# Patient Record
Sex: Male | Born: 1970 | Race: White | Hispanic: No | Marital: Single | Smoking: Never smoker
Health system: Southern US, Community
[De-identification: ages and names within clinical notes are randomized; demographics above are authoritative.]

## PROBLEM LIST (undated history)

## (undated) DIAGNOSIS — F73 Profound intellectual disabilities: Secondary | ICD-10-CM

## (undated) DIAGNOSIS — M1990 Unspecified osteoarthritis, unspecified site: Secondary | ICD-10-CM

## (undated) HISTORY — DX: Profound intellectual disabilities: F73

## (undated) HISTORY — PX: EYE SURGERY: SHX253

## (undated) HISTORY — PX: CHOLECYSTECTOMY: SHX55

---

## 1998-01-06 ENCOUNTER — Other Ambulatory Visit: Admission: RE | Admit: 1998-01-06 | Discharge: 1998-01-06 | Payer: Self-pay | Admitting: Orthopedic Surgery

## 1998-04-20 ENCOUNTER — Encounter: Payer: Self-pay | Admitting: Gastroenterology

## 1998-04-20 ENCOUNTER — Ambulatory Visit (HOSPITAL_COMMUNITY): Admission: RE | Admit: 1998-04-20 | Discharge: 1998-04-20 | Payer: Self-pay | Admitting: Family Medicine

## 1998-05-08 ENCOUNTER — Encounter (HOSPITAL_BASED_OUTPATIENT_CLINIC_OR_DEPARTMENT_OTHER): Payer: Self-pay | Admitting: General Surgery

## 1998-05-09 ENCOUNTER — Inpatient Hospital Stay (HOSPITAL_COMMUNITY): Admission: RE | Admit: 1998-05-09 | Discharge: 1998-05-10 | Payer: Self-pay | Admitting: General Surgery

## 1998-05-09 ENCOUNTER — Encounter (HOSPITAL_BASED_OUTPATIENT_CLINIC_OR_DEPARTMENT_OTHER): Payer: Self-pay | Admitting: General Surgery

## 1999-01-28 ENCOUNTER — Emergency Department (HOSPITAL_COMMUNITY): Admission: EM | Admit: 1999-01-28 | Discharge: 1999-01-28 | Payer: Self-pay | Admitting: Emergency Medicine

## 1999-03-13 ENCOUNTER — Emergency Department (HOSPITAL_COMMUNITY): Admission: EM | Admit: 1999-03-13 | Discharge: 1999-03-13 | Payer: Self-pay | Admitting: Emergency Medicine

## 2001-07-30 ENCOUNTER — Emergency Department (HOSPITAL_COMMUNITY): Admission: EM | Admit: 2001-07-30 | Discharge: 2001-07-30 | Payer: Self-pay | Admitting: Emergency Medicine

## 2005-01-13 ENCOUNTER — Emergency Department (HOSPITAL_COMMUNITY): Admission: EM | Admit: 2005-01-13 | Discharge: 2005-01-13 | Payer: Self-pay | Admitting: Emergency Medicine

## 2005-07-25 ENCOUNTER — Emergency Department (HOSPITAL_COMMUNITY): Admission: EM | Admit: 2005-07-25 | Discharge: 2005-07-25 | Payer: Self-pay | Admitting: Family Medicine

## 2006-05-10 ENCOUNTER — Encounter: Admission: RE | Admit: 2006-05-10 | Discharge: 2006-05-10 | Payer: Self-pay | Admitting: Family Medicine

## 2011-04-27 ENCOUNTER — Other Ambulatory Visit: Payer: Self-pay

## 2011-04-27 ENCOUNTER — Emergency Department (HOSPITAL_COMMUNITY)
Admission: EM | Admit: 2011-04-27 | Discharge: 2011-04-27 | Disposition: A | Discharge status: Home / Self Care | Payer: Medicare Other | Attending: Emergency Medicine | Admitting: Emergency Medicine

## 2011-04-27 DIAGNOSIS — R000 Tachycardia, unspecified: Secondary | ICD-10-CM | POA: Insufficient documentation

## 2011-04-27 DIAGNOSIS — IMO0002 Reserved for concepts with insufficient information to code with codable children: Secondary | ICD-10-CM | POA: Insufficient documentation

## 2011-04-27 DIAGNOSIS — F79 Unspecified intellectual disabilities: Secondary | ICD-10-CM | POA: Insufficient documentation

## 2011-04-27 DIAGNOSIS — T50901A Poisoning by unspecified drugs, medicaments and biological substances, accidental (unintentional), initial encounter: Secondary | ICD-10-CM | POA: Insufficient documentation

## 2011-04-27 MED ORDER — HALOPERIDOL LACTATE 5 MG/ML IJ SOLN
INTRAMUSCULAR | Status: AC
  Filled 2011-04-27: qty 2

## 2011-04-27 MED ORDER — MIDAZOLAM HCL 2 MG/2ML IJ SOLN
5.0000 mg | Freq: Once | INTRAMUSCULAR | Status: AC
  Administered 2011-04-27: 5 mg via INTRAMUSCULAR

## 2011-04-27 MED ORDER — MIDAZOLAM HCL 2 MG/2ML IJ SOLN
INTRAMUSCULAR | Status: AC
  Administered 2011-04-27: 5 mg via INTRAMUSCULAR
  Filled 2011-04-27: qty 6

## 2011-04-27 NOTE — ED Notes (Signed)
Pt from group home. Was doing am med pass. Pt picked up someone else meds, mistaking them for his. Staff reports pt has been acting dizzy, has not verbalized this. Pt had stroke a few months ago and staff wanted to make sure something else wasn't causing different behavior. Other pts MAR brought by staff. Pt took 5mg  Abilify, 150mg  Thorazine, 2mg  Cogentin, 50mg  Fluvoxamine, 1 cap vit D, 1 tab Pepcid. Pt does not normally take any of these meds. Also received his regular meds today.

## 2011-04-27 NOTE — ED Provider Notes (Signed)
History     CSN: [id number]  Arrival date & time 04/27/11  0932   First MD Initiated Contact with Patient 04/27/11 1002      Chief Complaint  Patient presents with  . Medication Reaction   level V caveat patient patient mentally retarded. History is obtained from Fred Christensen  And Fred Christensen, group, place to accompany patient Patient was actually at that we administered another clients medications 7:30 AM today patient was administered Abilify 5 mg Thorazine 150 mg Cogentin 2 mgfluvioxamine 50 mg vitamin D 1000 units and Pepcid 20 mg. He did not get any of his regular medications scheduled for this morning. Patient has been somewhat more somnolent since taking the other clients medications. (Consider location/radiation/quality/duration/timing/severity/associated sxs/prior treatment) HPI  No past medical history on file. Mental retardation No past surgical history on file.  No family history on file.  History  Substance Use Topics  . Smoking status: Not on file  . Smokeless tobacco: Not on file  . Alcohol Use: Not on file     lives in group home  Review of Systems  Unable to perform ROS: Dementia    Allergies  Review of patient's allergies indicates no known allergies.  Home Medications  No current outpatient prescriptions on file.  BP 160/100  Resp 20  SpO2 97%  Physical Exam  Nursing note and vitals reviewed. HENT:  Head: Normocephalic and atraumatic.  Eyes: Conjunctivae are normal. Pupils are equal, round, and reactive to light.  Neck: Neck supple. No tracheal deviation present. No thyromegaly present.  Cardiovascular: Regular rhythm.   No murmur heard.      Tachycardic  Pulmonary/Chest: Effort normal and breath sounds normal.  Abdominal: Soft. Bowel sounds are normal. He exhibits no distension. There is no tenderness.  Musculoskeletal: Normal range of motion. He exhibits no edema and no tenderness.  Neurological: He is alert. Coordination normal.   Agitated moves all extremities motor strength 5 over 5 overall  Skin: Skin is warm and dry. No rash noted.  Psychiatric: He has a normal mood and affect.    ED Course  Procedures (including critical care time)  Labs Reviewed - No data to display No results found.  Note Fred. Romeo Apple reports that patient is typically agitated when he has a medical encounter he gets premedicated with Halcion typically before a procedure Patient administered Versed 5 mg IM as he was agitated and somewhat combative at 10:35 AM he is resting comfortably  No diagnosis found.   Date: 04/27/2011  Rate: 115  Rhythm: sinus tachycardia  QRS Axis: normal  Intervals: normal  ST/T Wave abnormalities: normal  Conduction Disutrbances:none  Narrative Interpretation:   Old EKG Reviewed: none available Spoke with [facility]. The medications listed but the patient was accidentally administered may cause tachycardia and somnolence. Suggest observation for 4 hours post ingestion no further treatment necessary   1:55 PM alertacting at baseline   MDM  nontoxic ingestion   I plan to resume  Pm medications         Doug Sou, MD 04/27/11 1357

## 2011-04-27 NOTE — ED Notes (Signed)
Unable to obtain Hx, allergies; paperwork to be faxed over from group home. Unable to obtain temp at this time due to pts uncooperativeness.

## 2011-04-27 NOTE — ED Notes (Signed)
Pt will not leave O2 sat monitor on and removed taped sat monitor.

## 2011-04-27 NOTE — ED Notes (Signed)
Versed 5mg  given IM per MD order and Haldol d/c'd

## 2011-04-27 NOTE — ED Notes (Signed)
Pt combative with VS and EKG, attempting to hit, scratch and bite. Group home staff at bed side to assist with pt care.

## 2011-04-27 NOTE — Discharge Instructions (Signed)
Mr. Qin and his usual evening medications tonight. Return if his condition worsens for any reason

## 2011-04-27 NOTE — ED Notes (Signed)
RN reattempted EKG, pt becomes combative and no viable results obtained

## 2011-11-15 DIAGNOSIS — K5909 Other constipation: Secondary | ICD-10-CM | POA: Insufficient documentation

## 2011-11-15 DIAGNOSIS — E559 Vitamin D deficiency, unspecified: Secondary | ICD-10-CM | POA: Insufficient documentation

## 2011-11-15 DIAGNOSIS — F6381 Intermittent explosive disorder: Secondary | ICD-10-CM | POA: Insufficient documentation

## 2011-11-15 DIAGNOSIS — F809 Developmental disorder of speech and language, unspecified: Secondary | ICD-10-CM | POA: Insufficient documentation

## 2011-11-15 DIAGNOSIS — H539 Unspecified visual disturbance: Secondary | ICD-10-CM | POA: Insufficient documentation

## 2011-11-15 DIAGNOSIS — F73 Profound intellectual disabilities: Secondary | ICD-10-CM | POA: Insufficient documentation

## 2012-08-12 ENCOUNTER — Emergency Department (HOSPITAL_COMMUNITY): Payer: Medicare Other

## 2012-08-12 ENCOUNTER — Emergency Department (HOSPITAL_COMMUNITY)
Admission: EM | Admit: 2012-08-12 | Discharge: 2012-08-12 | Disposition: A | Discharge status: Home / Self Care | Payer: Medicare Other | Attending: Emergency Medicine | Admitting: Emergency Medicine

## 2012-08-12 ENCOUNTER — Encounter (HOSPITAL_COMMUNITY): Payer: Self-pay | Admitting: *Deleted

## 2012-08-12 DIAGNOSIS — S1093XA Contusion of unspecified part of neck, initial encounter: Secondary | ICD-10-CM | POA: Insufficient documentation

## 2012-08-12 DIAGNOSIS — S0003XA Contusion of scalp, initial encounter: Secondary | ICD-10-CM | POA: Insufficient documentation

## 2012-08-12 DIAGNOSIS — R296 Repeated falls: Secondary | ICD-10-CM | POA: Insufficient documentation

## 2012-08-12 DIAGNOSIS — H61121 Hematoma of pinna, right ear: Secondary | ICD-10-CM

## 2012-08-12 DIAGNOSIS — Y9389 Activity, other specified: Secondary | ICD-10-CM | POA: Insufficient documentation

## 2012-08-12 DIAGNOSIS — Z79899 Other long term (current) drug therapy: Secondary | ICD-10-CM | POA: Insufficient documentation

## 2012-08-12 DIAGNOSIS — Y929 Unspecified place or not applicable: Secondary | ICD-10-CM | POA: Insufficient documentation

## 2012-08-12 HISTORY — DX: Unspecified osteoarthritis, unspecified site: M19.90

## 2012-08-12 MED ORDER — KETAMINE HCL 50 MG/ML IJ SOLN: 23.8000 mg | Freq: Once | INTRAMUSCULAR | Status: DC

## 2012-08-12 MED ORDER — KETAMINE HCL 10 MG/ML IJ SOLN: 0.5000 mg/kg | Freq: Once | INTRAMUSCULAR | Status: DC

## 2012-08-12 MED ORDER — KETAMINE HCL 50 MG/ML IJ SOLN
23.8000 mg | Freq: Once | INTRAMUSCULAR | Status: AC
  Administered 2012-08-12: 23.8 mg via INTRAMUSCULAR

## 2012-08-12 MED ORDER — MIDAZOLAM HCL 2 MG/2ML IJ SOLN
2.0000 mg | Freq: Once | INTRAMUSCULAR | Status: AC
  Administered 2012-08-12: 2 mg via INTRAVENOUS
  Filled 2012-08-12: qty 2
  Filled 2012-08-12: qty 4

## 2012-08-12 MED ORDER — MIDAZOLAM HCL 2 MG/2ML IJ SOLN
4.0000 mg | Freq: Once | INTRAMUSCULAR | Status: AC
  Administered 2012-08-12: 4 mg via INTRAVENOUS
  Filled 2012-08-12: qty 4

## 2012-08-12 MED ORDER — MIDAZOLAM HCL 2 MG/2ML IJ SOLN
2.0000 mg | Freq: Once | INTRAMUSCULAR | Status: AC
  Administered 2012-08-12: 2 mg via INTRAVENOUS
  Filled 2012-08-12: qty 2

## 2012-08-12 MED ORDER — KETAMINE HCL 50 MG/ML IJ SOLN
30.0000 mg | Freq: Once | INTRAMUSCULAR | Status: AC
  Administered 2012-08-12: 30 mg via INTRAMUSCULAR

## 2012-08-12 MED ORDER — KETAMINE HCL 50 MG/ML IJ SOLN
40.0000 mg | Freq: Once | INTRAMUSCULAR | Status: AC
  Administered 2012-08-12: 40 mg via INTRAMUSCULAR

## 2012-08-12 MED ORDER — KETAMINE HCL 50 MG/ML IJ SOLN
23.8000 mg | Freq: Once | INTRAMUSCULAR | Status: AC
  Administered 2012-08-12: 15:00:00 via INTRAMUSCULAR

## 2012-08-12 MED ORDER — MIDAZOLAM HCL 5 MG/5ML IJ SOLN: 2.0000 mg | Freq: Once | INTRAMUSCULAR | Status: DC

## 2012-08-12 MED ORDER — MIDAZOLAM HCL 2 MG/2ML IJ SOLN
4.0000 mg | Freq: Once | INTRAMUSCULAR | Status: AC
  Administered 2012-08-12: 4 mg via INTRAVENOUS

## 2012-08-12 NOTE — ED Notes (Signed)
PT parents arrived to discuss Plane of care for CT.

## 2012-08-12 NOTE — ED Notes (Signed)
Meds given for CT given.

## 2012-08-12 NOTE — ED Notes (Signed)
PT arrived to room E41 alert and agitated . This is his base line. Pt lives in a group home . The report is Pt fell and hit his RT ear and bruising is seen on external ear surface. Pt speech is with in normal limits . Pt agitated and unable to tol B/P.

## 2012-08-12 NOTE — ED Provider Notes (Signed)
History    CSN: [id number] Arrival date & time 08/12/12  1100  First MD Initiated Contact with Patient 08/12/12 1122     No chief complaint on file.  (Consider location/radiation/quality/duration/timing/severity/associated sxs/prior Treatment) HPI Comments: Fred Christensen is a 42yo male with intellectual disability who resides at [facility].  Patient is unable to provide history secondary to mentation.  [name] and [name], staff of [facility] brought Fred Christensen to the ED and are providing the history.  They report that Fred Christensen fell on his right side yesterday morning while getting up for his shower.  The staff say they saw no evidence of bruising or swelling yesterday but today noticed his right ear was swollen and and bruised.  They say he did not appear to hit the back of his head or face.  He generally communicates pain via non-verbal signing or grabbing staff's arm.  They say it is hard to tell if he is currently having ear pain.   They deny any change in his behavior from his baseline agitation.  He has had no rhinorrhea, bleeding from the ear or mouth, N/V.   No past medical history on file. No past surgical history on file. No family history on file. History  Substance Use Topics  . Smoking status: Never Smoker   . Smokeless tobacco: Not on file  . Alcohol Use: No    Review of Systems  Unable to perform ROS: Other    Allergies  Review of patient's allergies indicates no known allergies.  Home Medications   Current Outpatient Rx  Name  Route  Sig  Dispense  Refill  . acetaminophen (TYLENOL) 325 MG tablet   Oral   Take 650 mg by mouth every morning.         Marland Kitchen acetaminophen (TYLENOL) 650 MG CR tablet   Oral   Take 650 mg by mouth at bedtime.         . carbamazepine (TEGRETOL) 200 MG tablet   Oral   Take 200 mg by mouth 2 (two) times daily.         . cholecalciferol (VITAMIN D) 1000 UNITS tablet   Oral   Take 1,000 Units by mouth daily.        . fluticasone (FLONASE) 50 MCG/ACT nasal spray   Nasal   Place 2 sprays into the nose daily.         Marland Kitchen gabapentin (NEURONTIN) 300 MG capsule   Oral   Take 300 mg by mouth 2 (two) times daily.         Marland Kitchen lactulose (CHRONULAC) 10 GM/15ML solution   Oral   Take 30 g by mouth at bedtime.         . naltrexone (DEPADE) 50 MG tablet   Oral   Take 25 mg by mouth 3 (three) times daily.         Marland Kitchen omeprazole (PRILOSEC) 20 MG capsule   Oral   Take 20 mg by mouth daily.         . psyllium (HYDROCIL/METAMUCIL) 95 % PACK   Oral   Take 1 packet by mouth every morning.          BP 134/88  Pulse 108  Temp(Src) 99.4 F (37.4 C) (Oral)  Resp 20 Physical Exam  HENT:  Left Ear: External ear normal.  Mouth/Throat: Oropharynx is clear and moist.  Right ear antihelix with ecchymosis, hematoma.  Ecchymosis of mastoid behind R ear.  Eyes:  Patient  squeezing eyes shut during eye exam.  Neck: Neck supple.  Cardiovascular: Normal rate and regular rhythm.   Pulmonary/Chest: Effort normal and breath sounds normal.  Musculoskeletal: Normal range of motion.    ED Course  Procedural sedation Date/Time: 08/12/2012 7:25 PM Performed by: Ronna Polio, ANN Authorized by: Ronna Polio, ANN Consent: Verbal consent obtained. written consent obtained. Risks and benefits: risks, benefits and alternatives were discussed Consent given by: guardian and parent Required items: required blood products, implants, devices, and special equipment available Patient identity confirmed: arm band Time out: Immediately prior to procedure a "time out" was called to verify the correct patient, procedure, equipment, support staff and site/side marked as required. Local anesthesia used: no Patient sedated: yes Sedation type: moderate (conscious) sedation Sedatives: ketamine and midazolam Vitals: Vital signs were monitored during sedation. Patient tolerance: Patient tolerated the procedure well  with no immediate complications. Comments: This patient required procedural sedation in order to obtain head CT. Multiple doses of ketamine IV were given as well as multiple doses of Versed IV. Her main with the patient during the entire procedure. He had no hemodynamic or respiratory instability per my exam. She tolerated the CT scan well.   (including critical care time) Labs Reviewed - No data to display No results found. No diagnosis found.  MDM  Concern for basilar skull fracture given bruising of mastoid.  Will need CT to assess.  Pt unable to undergo procedures without substantial sedation due to baseline agitation.  We spoke with patient's guardians (mother and step-father) regarding the need for CT and the plan to medicate pt prior to CT.  Risk and benefits of sedation were discussed and parents consented.  Spoke with Dr. Pollyann Kennedy of [facility] regarding right auricular hematoma.  He advised that drainage was not emergent and that the patient can be seen in office tomorrow for drainage.  [name], DO 08/12/12 1311    Attending Note: Fred Christensen is a 42 year old gentleman with a history of intellectual disability who currently resides in assisted living type situation. His caretakers noted that he had a fall yesterday and brought him into the emergency department today due to concern over a bruise to his right ear. Exam and history is extremely limited due to the patient's mental status. He does have ecchymosis to his mastoid which is concerning for possible skull fracture; therefore, CT scan is necessary to evaluate further. He also has a hematoma to his ear which is small in size. We did discuss the potential treatment of this with on-call ENT, who states the patient can followup as an outpatient. Due to the patient's mental status and significant agitation, procedural sedation was required in order to obtain CT scan. Multiple doses of IV ketamine and Versed were given in order  to obtain sedation , and patient maintained his airway and tolerated procedure well. CT scan was negative for any intracranial or bony injuries. The patient was discharged home with followup instructions.  Ashby Dawes, MD 08/12/12 [phone]

## 2012-11-29 ENCOUNTER — Other Ambulatory Visit: Payer: Self-pay

## 2014-01-06 ENCOUNTER — Ambulatory Visit (INDEPENDENT_AMBULATORY_CARE_PROVIDER_SITE_OTHER): Payer: Medicare Other | Admitting: Podiatry

## 2014-01-06 ENCOUNTER — Encounter: Payer: Self-pay | Admitting: Podiatry

## 2014-01-06 VITALS — Ht 68.0 in | Wt 90.0 lb

## 2014-01-06 DIAGNOSIS — B351 Tinea unguium: Secondary | ICD-10-CM

## 2014-01-06 DIAGNOSIS — M79676 Pain in unspecified toe(s): Secondary | ICD-10-CM

## 2014-01-06 NOTE — Progress Notes (Signed)
   Subjective:    Patient ID: Fred CombsCharles W Tartt, male    DOB: 11/30/1970, 43 y.o.   MRN: [id number]  HPI Comments: N debridement L 1-5 right toenails D and O long-term C elongated, painful toenails A too thick for caregiver to trim T hx of debridement of left 1-5 toenails by caregiver  Pt refused to have vital signs taken.     Review of Systems  All other systems reviewed and are negative.      Objective:   Physical Exam This patient presents with 2 caregivers from assisted-living He is nonverbal and noncooperative  Vascular: DP and PT pulses 2/4 bilaterally  Neurological: Ankle flex equal and reactive bilaterally  Dermatological: The toenails are extremely elongated, hypertrophic, discolored 6-10  Musculoskeletal: Hammertoe deformity second bilaterally       Assessment & Plan:   Assessment: Mentally impaired nonverbal patient Symptomatic onychomycoses 6-10  Plan: Debrided toenails 10 without a bleeding  Reappoint as needed

## 2014-01-07 ENCOUNTER — Encounter: Payer: Self-pay | Admitting: Podiatry

## 2017-03-31 ENCOUNTER — Emergency Department (HOSPITAL_BASED_OUTPATIENT_CLINIC_OR_DEPARTMENT_OTHER)
Admission: EM | Admit: 2017-03-31 | Discharge: 2017-03-31 | Disposition: A | Discharge status: Home / Self Care | Payer: Medicare Other | Attending: Emergency Medicine | Admitting: Emergency Medicine

## 2017-03-31 ENCOUNTER — Other Ambulatory Visit: Payer: Self-pay

## 2017-03-31 ENCOUNTER — Encounter (HOSPITAL_BASED_OUTPATIENT_CLINIC_OR_DEPARTMENT_OTHER): Payer: Self-pay | Admitting: *Deleted

## 2017-03-31 DIAGNOSIS — S01511A Laceration without foreign body of lip, initial encounter: Secondary | ICD-10-CM | POA: Insufficient documentation

## 2017-03-31 DIAGNOSIS — Z23 Encounter for immunization: Secondary | ICD-10-CM | POA: Diagnosis not present

## 2017-03-31 DIAGNOSIS — Z79899 Other long term (current) drug therapy: Secondary | ICD-10-CM | POA: Diagnosis not present

## 2017-03-31 DIAGNOSIS — W0110XA Fall on same level from slipping, tripping and stumbling with subsequent striking against unspecified object, initial encounter: Secondary | ICD-10-CM | POA: Diagnosis not present

## 2017-03-31 DIAGNOSIS — Y929 Unspecified place or not applicable: Secondary | ICD-10-CM | POA: Diagnosis not present

## 2017-03-31 DIAGNOSIS — Y999 Unspecified external cause status: Secondary | ICD-10-CM | POA: Insufficient documentation

## 2017-03-31 DIAGNOSIS — F73 Profound intellectual disabilities: Secondary | ICD-10-CM | POA: Insufficient documentation

## 2017-03-31 DIAGNOSIS — Y939 Activity, unspecified: Secondary | ICD-10-CM | POA: Insufficient documentation

## 2017-03-31 MED ORDER — ACETAMINOPHEN 160 MG/5ML PO SOLN
650.0000 mg | Freq: Once | ORAL | Status: AC
  Administered 2017-03-31: 650 mg via ORAL
  Filled 2017-03-31: qty 20.3

## 2017-03-31 MED ORDER — ONDANSETRON HCL 4 MG/2ML IJ SOLN
4.0000 mg | Freq: Once | INTRAMUSCULAR | Status: AC
  Administered 2017-03-31: 4 mg via INTRAVENOUS
  Filled 2017-03-31: qty 2

## 2017-03-31 MED ORDER — LIDOCAINE HCL (PF) 1 % IJ SOLN
5.0000 mL | Freq: Once | INTRAMUSCULAR | Status: AC
  Administered 2017-03-31: 5 mL
  Filled 2017-03-31: qty 5

## 2017-03-31 MED ORDER — TETANUS-DIPHTH-ACELL PERTUSSIS 5-2.5-18.5 LF-MCG/0.5 IM SUSP
0.5000 mL | Freq: Once | INTRAMUSCULAR | Status: AC
  Administered 2017-03-31: 0.5 mL via INTRAMUSCULAR
  Filled 2017-03-31: qty 0.5

## 2017-03-31 MED ORDER — KETAMINE HCL 10 MG/ML IJ SOLN
INTRAMUSCULAR | Status: AC
  Filled 2017-03-31: qty 1

## 2017-03-31 MED ORDER — ETOMIDATE 2 MG/ML IV SOLN
INTRAVENOUS | Status: AC | PRN
  Administered 2017-03-31: 6 mg via INTRAVENOUS

## 2017-03-31 MED ORDER — KETAMINE HCL 10 MG/ML IJ SOLN
INTRAMUSCULAR | Status: AC | PRN
  Administered 2017-03-31: 38 mg via INTRAVENOUS
  Administered 2017-03-31 (×2): 6 mg via INTRAVENOUS
  Administered 2017-03-31 (×2): 20 mg via INTRAVENOUS

## 2017-03-31 MED ORDER — ETOMIDATE 2 MG/ML IV SOLN
20.0000 mg | Freq: Once | INTRAVENOUS | Status: AC
  Administered 2017-03-31: 20 mg via INTRAVENOUS
  Filled 2017-03-31: qty 10

## 2017-03-31 NOTE — ED Notes (Signed)
EDP and this RN in room to review procedure with pt parents. Consent signed.

## 2017-03-31 NOTE — Sedation Documentation (Signed)
Pt very restless, moving back and forth in bed. Pt shaking head and arms. Unable to obtain vitals.

## 2017-03-31 NOTE — ED Provider Notes (Signed)
Medical screening examination/treatment/procedure(s) were conducted as a shared visit with non-physician practitioner(s) and myself.  I personally evaluated the patient during the encounter. Briefly, the patient is a 47 y.o. male chemical fall resulting in upper lip laceration approximately 1 cm involving the vermilion border.  Tetanus updated in the emergency department.  Wound was irrigated and closed with 1 suture under conscious sedation.    EKG Interpretation None        .Sedation Date/Time: 03/31/2017 6:12 PM Performed by: Nira Conn, MD Authorized by: Nira Conn, MD   Consent:    Consent obtained:  Verbal and written   Consent given by:  Parent   Risks discussed:  Allergic reaction, dysrhythmia, inadequate sedation, nausea, prolonged hypoxia resulting in organ damage, prolonged sedation necessitating reversal, respiratory compromise necessitating ventilatory assistance and intubation and vomiting   Alternatives discussed:  Analgesia without sedation, anxiolysis and regional anesthesia Universal protocol:    Procedure explained and questions answered to patient or proxy's satisfaction: yes     Relevant documents present and verified: yes     Test results available and properly labeled: yes     Imaging studies available: yes     Required blood products, implants, devices, and special equipment available: yes     Site/side marked: yes     Immediately prior to procedure a time out was called: yes     Patient identity confirmation method:  Verbally with patient Indications:    Procedure necessitating sedation performed by:  Physician performing sedation   Intended level of sedation:  Deep Pre-sedation assessment:    Time since last food or drink:  1200p   ASA classification: class 1 - normal, healthy patient     Neck mobility: normal     Mouth opening:  3 or more finger widths   Thyromental distance:  4 finger widths   Mallampati score:  I - soft palate,  uvula, fauces, pillars visible   Pre-sedation assessments completed and reviewed: airway patency, cardiovascular function, hydration status, mental status, nausea/vomiting, pain level, respiratory function and temperature     Pre-sedation assessment completed:  03/31/2017 6:10 PM Immediate pre-procedure details:    Reassessment: Patient reassessed immediately prior to procedure     Reviewed: vital signs, relevant labs/tests and NPO status     Verified: bag valve mask available, emergency equipment available, intubation equipment available, IV patency confirmed, oxygen available and suction available   Procedure details (see MAR for exact dosages):    Preoxygenation:  Nasal cannula   Sedation:  Ketamine and etomidate   Intra-procedure monitoring:  Blood pressure monitoring, cardiac monitor, continuous pulse oximetry, frequent LOC assessments, frequent vital sign checks and continuous capnometry   Intra-procedure events: none     Total Provider sedation time (minutes):  20 Post-procedure details:    Post-sedation assessment completed:  03/31/2017 6:55 PM   Attendance: Constant attendance by certified staff until patient recovered     Recovery: Patient returned to pre-procedure baseline     Post-sedation assessments completed and reviewed: airway patency, cardiovascular function, hydration status, mental status, nausea/vomiting, pain level, respiratory function and temperature     Patient is stable for discharge or admission: yes     Patient tolerance:  Tolerated well, no immediate complications  .Marland KitchenLaceration Repair Date/Time: 03/31/2017 6:55 PM Performed by: Nira Conn, MD Authorized by: Nira Conn, MD   Consent:    Consent obtained:  Written   Consent given by:  Parent   Risks discussed:  Poor cosmetic result  Alternatives discussed:  Delayed treatment Laceration details:    Location:  Lip   Lip location:  Upper exterior lip   Length (cm):  1 Repair type:     Repair type:  Simple Treatment:    Area cleansed with:  Betadine   Amount of cleaning:  Standard   Irrigation solution:  Sterile saline   Irrigation method:  Syringe Skin repair:    Repair method:  Sutures   Suture size:  5-0   Wound skin closure material used: Vicryl rapide.   Suture technique:  Simple interrupted   Number of sutures:  1 Approximation:    Approximation:  Close   Vermilion border: well-aligned   Post-procedure details:    Dressing:  Antibiotic ointment      Faatimah Spielberg, Amadeo GarnetPedro Eduardo, MD 03/31/17 1856

## 2017-03-31 NOTE — ED Notes (Signed)
ED Provider at bedside. 

## 2017-03-31 NOTE — Sedation Documentation (Signed)
Pt interacting with family as he was prior to procedure. Pt appears to be in NAD. Pt requesting something to eat.

## 2017-03-31 NOTE — Sedation Documentation (Addendum)
Pt restless. MD verbal order for 6mg  of atomidate

## 2017-03-31 NOTE — ED Notes (Signed)
Pt parents and caregiver at bedside

## 2017-03-31 NOTE — Discharge Instructions (Addendum)
As discussed, follow up with his Primary care provider in 3 days for wound check.  Monitor for any signs of infection and return sooner if swelling, pain, purulence, redness, fever, chills or any other new concerning symptoms in the meantime.  Tylenol or ibuprofen for pain as needed.

## 2017-03-31 NOTE — ED Notes (Signed)
Pt given gingerale and crackers 

## 2017-03-31 NOTE — ED Provider Notes (Signed)
[facility] EMERGENCY DEPARTMENT Provider Note   CSN: [id number] Arrival date & time: 03/31/17  1304     History   Chief Complaint Chief Complaint  Patient presents with  . Fall  . Facial Injury    HPI Fred Christensen is a 47 y.o. male with past history of arthritis and profound mental delay presenting with facial trauma after a trip and fall in which he lacerated his upper lip.  Per caregiver patient is at baseline, no loss of consciousness.  Denies any other injuries or trauma.  He currently lives in a group home and his mother and father are legal guardian.  Patient verbal responses are non-comprehensible and he is pointing at the otoscope to have his ears checked and continuously pointing at his upper lip.   HPI  Past Medical History:  Diagnosis Date  . Arthritis   . Profound mental retardation     There are no active problems to display for this patient.   History reviewed. No pertinent surgical history.     Home Medications    Prior to Admission medications   Medication Sig Start Date End Date Taking? Authorizing Provider  acetaminophen (TYLENOL) 325 MG tablet Take 650 mg by mouth every morning.    [provider]  acetaminophen (TYLENOL) 650 MG CR tablet Take 650 mg by mouth every evening.     [provider]  carbamazepine (TEGRETOL) 200 MG tablet Take 200 mg by mouth 2 (two) times daily.    [provider]  cholecalciferol (VITAMIN D) 1000 UNITS tablet Take 1,000 Units by mouth daily.    [provider]  fluticasone (FLONASE) 50 MCG/ACT nasal spray Place 2 sprays into the nose daily.    [provider]  gabapentin (NEURONTIN) 300 MG capsule Take 300 mg by mouth 2 (two) times daily.    [provider]  lactulose (CHRONULAC) 10 GM/15ML solution Take 30 g by mouth at bedtime.    [provider]  naltrexone (DEPADE) 50 MG tablet Take 25 mg by mouth 3 (three) times daily.    [provider]  omeprazole (PRILOSEC) 20 MG capsule Take 20 mg by mouth daily.    [provider]  psyllium (HYDROCIL/METAMUCIL) 95 % PACK Take 1 packet by mouth every morning.    [provider]    Family History No family history on file.  Social History Social History   Tobacco Use  . Smoking status: Never Smoker  . Smokeless tobacco: Never Used  Substance Use Topics  . Alcohol use: No  . Drug use: No     Allergies   Haldol [haloperidol]   Review of Systems Review of Systems  Unable to perform ROS: Patient nonverbal  Skin: Positive for wound.     Physical Exam Updated Vital Signs BP (!) 125/94 (BP Location: Right Arm)   Pulse 95   Temp 98.2 F (36.8 C) (Oral)   Resp 19   SpO2 99%   Physical Exam  Constitutional: He appears well-developed and well-nourished. No distress.  Well-appearing, nontoxic sitting in the bed no acute distress agitated making verbal sounds and pointing at his ears and otoscope as well as the upper lip.   HENT:  Head: Normocephalic and atraumatic.  Right Ear: External ear normal.  Left Ear: External ear normal.  Mouth/Throat: Oropharynx is clear and moist.  Normal tympanic membranes bilaterally. Teeth are sharp and damaged at baseline. No new injury to the teeth  Eyes: Conjunctivae are normal.  Neck: Normal range of motion. Neck supple.  Cardiovascular: Normal rate, regular rhythm and normal heart sounds.  No murmur heard. Pulmonary/Chest: Effort normal and breath sounds normal. No stridor. No respiratory distress. He has no wheezes. He has no rales.  Abdominal: He exhibits no distension.  Musculoskeletal: Normal range of motion. He exhibits no edema, tenderness or deformity.  Patient is ambulatory  Neurological: He is alert.  Normal stance and gait  Skin: Skin is warm and dry. No rash noted. He is not diaphoretic. No erythema. No pallor.  Approximately 1 cm vertical midline laceration crossing the vermilion border  of the upper lip.  Psychiatric: He has a normal mood and affect.  Nursing note and vitals reviewed.    ED Treatments / Results  Labs (all labs ordered are listed, but only abnormal results are displayed) Labs Reviewed - No data to display  EKG  EKG Interpretation None       Radiology No results found.  Procedures .Sedation Date/Time: 03/31/2017 5:38 PM Performed by: Georgiana Shore, PA-C Authorized by: Georgiana Shore, PA-C   Consent:    Consent obtained:  Verbal and written   Consent given by:  Guardian Indications:    Procedure performed:  Laceration repair Immediate pre-procedure details:    Reviewed: vital signs     Verified: bag valve mask available, emergency equipment available, intubation equipment available, IV patency confirmed, oxygen available and suction available     (See Dr. Harland Dingwall chart for full procedure note)   LACERATION REPAIR Performed by: Georgiana Shore Dr. Eudelia Bunch Authorized by: Georgiana Shore Consent: Verbal consent obtained. Risks and benefits: risks, benefits and alternatives were discussed Consent given by: patient Patient identity confirmed: provided demographic data Prepped and Draped in normal sterile fashion Wound explored  Laceration Location: upper lip crossing the vermillion border  Laceration Length: 1cm  No Foreign Bodies seen or palpated  Anesthesia: local infiltration  Local anesthetic: n/a  Anesthetic total: n/a  Irrigation method: syringe Amount of cleaning: standard  Skin closure: 5.0 vicryl  Number of sutures: 1  Technique: simple interrupted  Patient tolerance: Patient sedated for procedure   Medications Ordered in ED Medications  lidocaine (PF) (XYLOCAINE) 1 % injection 5 mL (5 mLs Infiltration Given 03/31/17 1749)  Tdap (BOOSTRIX) injection 0.5 mL (0.5 mLs Intramuscular Given 03/31/17 1750)  acetaminophen (TYLENOL) solution 650 mg (650 mg Oral Given 03/31/17 1606)  etomidate  (AMIDATE) injection 20 mg (20 mg Intravenous Given 03/31/17 1812)  ketamine (KETALAR) injection (20 mg Intravenous Given 03/31/17 1831)  etomidate (AMIDATE) injection (6 mg Intravenous Given 03/31/17 1820)  ondansetron (ZOFRAN) injection 4 mg (4 mg Intravenous Given 03/31/17 1949)     Initial Impression / Assessment and Plan / ED Course  I have reviewed the triage vital signs and the nursing notes.  Pertinent labs & imaging results that were available during my care of the patient were reviewed by me and considered in my medical decision making (see chart for details).     Presents with laceration of upper lip after trip and fall, no loc, no other injury or trauma. Patient with severe mental delay requiring sedation for repair. Parents and legal guardians at bedside provided consent for procedural sedation.  Lac repair under sedation with Dr. Eudelia Bunch.  irrigation performed. Wound explored and base of wound visualized in a bloodless field without evidence of foreign body.  Laceration occurred < 8 hours prior to repair which was well tolerated. Tdap updated.  Pt has no comorbidities  to effect normal wound healing. Pt discharged  without antibiotics.    Discussed suture home care with patient and answered questions. Pt to follow-up for wound check in 3-5 days with PCP; they are to return to the ED sooner for signs of infection. Pt is hemodynamically stable with no complaints prior to dc.   Just prior to discharge, patient were concerned that he was pointing at his abdomen and was very active for a while but became sleepy and less energetic. They asked if this may be due to the sedating medications given and are worried about him vomiting during travel for a 40 minute drive.  His abdomen was soft and non-tender to palpation and he was back to being very active and mom stated that he was now his normal self again. Patient had crackers and Ginger ale and tolerating PO well. No vomiting. He was given zofran  in the ED.  Discharge home with close PCP follow up. Discussed strict return precautions and advised to return to the emergency department if experiencing any new or worsening symptoms. Instructions were understood and parents agreed with discharge plan. Final Clinical Impressions(s) / ED Diagnoses   Final diagnoses:  Lip laceration, initial encounter    ED Discharge Orders    None       Georgiana ShoreMitchell, Jessica B, [place]-C 03/31/17 1955

## 2017-03-31 NOTE — Sedation Documentation (Signed)
Pt very restless. Moving back and forth in bed.

## 2017-03-31 NOTE — Sedation Documentation (Signed)
etCO2 unable to be continuously monitored due to place of wound and EDP needing to apply sutures.

## 2017-03-31 NOTE — ED Triage Notes (Signed)
He tripped and fell. Upper lip laceration.

## 2018-01-02 ENCOUNTER — Emergency Department (HOSPITAL_BASED_OUTPATIENT_CLINIC_OR_DEPARTMENT_OTHER): Payer: Medicare Other

## 2018-01-02 ENCOUNTER — Encounter (HOSPITAL_BASED_OUTPATIENT_CLINIC_OR_DEPARTMENT_OTHER): Payer: Self-pay | Admitting: Emergency Medicine

## 2018-01-02 ENCOUNTER — Other Ambulatory Visit: Payer: Self-pay

## 2018-01-02 ENCOUNTER — Emergency Department (HOSPITAL_BASED_OUTPATIENT_CLINIC_OR_DEPARTMENT_OTHER)
Admission: EM | Admit: 2018-01-02 | Discharge: 2018-01-02 | Disposition: A | Discharge status: Home / Self Care | Payer: Medicare Other | Attending: Emergency Medicine | Admitting: Emergency Medicine

## 2018-01-02 DIAGNOSIS — M25561 Pain in right knee: Secondary | ICD-10-CM | POA: Diagnosis present

## 2018-01-02 DIAGNOSIS — F73 Profound intellectual disabilities: Secondary | ICD-10-CM | POA: Diagnosis not present

## 2018-01-02 DIAGNOSIS — Z79899 Other long term (current) drug therapy: Secondary | ICD-10-CM | POA: Insufficient documentation

## 2018-01-02 MED ORDER — ACETAMINOPHEN 325 MG PO TABS
650.0000 mg | ORAL_TABLET | Freq: Once | ORAL | Status: AC
  Administered 2018-01-02: 650 mg via ORAL
  Filled 2018-01-02: qty 2

## 2018-01-02 NOTE — Discharge Instructions (Signed)
X-ray shows no evidence of fracture or fluid on the joint.  Please use Tylenol 650 mg every 6 hours as needed for pain.  You can alternate ice and heat for 20 minutes, and use knee sleeve to help with support.  If symptoms are not improving over the next week please follow-up with your primary care doctor for further evaluation.  Return if the knee becomes red hot or swollen, patient has fevers, is unable to bend or straighten the knee at all or any other new or concerning symptoms.

## 2018-01-02 NOTE — ED Provider Notes (Signed)
[facility] EMERGENCY DEPARTMENT Provider Note   CSN: [id number] Arrival date & time: 01/02/18  1049     History   Chief Complaint Chief Complaint  Patient presents with  . Knee Pain    HPI Fred Christensen is a 47 y.o. male.  Fred Christensen is a 47 y.o. Male history of severe MR and arthritis, presents to the emergency department accompanied by his caregivers for evaluation of right knee pain.  Patient is only able to provide limited information the majority of the history is given by caregiver who reports that intermittently over the last few days patient has been complaining of pain in his right knee.  She reports he has not had any fevers and she has not noted any swelling over the knee, third shift caregiver last night had noted some mild swelling but it has completely resolved without intervention.  He has not had any redness or warmth over the knee, she reports that it seems to be less flexible than usual and he is not bending and straightening as well as before but he is still been ambulatory on the knee without difficulty.  They have not tried anything to treat this pain prior to arrival and he has not been seen by his PCP.  No history of prior surgery or injury to the knee that she knows of.   Level 5 caveat: Severe MR     Past Medical History:  Diagnosis Date  . Arthritis   . Profound mental retardation     There are no active problems to display for this patient.   History reviewed. No pertinent surgical history.      Home Medications    Prior to Admission medications   Medication Sig Start Date End Date Taking? Authorizing Provider  acetaminophen (TYLENOL) 325 MG tablet Take 650 mg by mouth every morning.    [provider]  acetaminophen (TYLENOL) 650 MG CR tablet Take 650 mg by mouth every evening.     [provider]  carbamazepine (TEGRETOL) 200 MG tablet Take 200 mg by mouth 2 (two) times daily.    [provider]   cholecalciferol (VITAMIN D) 1000 UNITS tablet Take 1,000 Units by mouth daily.    [provider]  fluticasone (FLONASE) 50 MCG/ACT nasal spray Place 2 sprays into the nose daily.    [provider]  gabapentin (NEURONTIN) 300 MG capsule Take 300 mg by mouth 2 (two) times daily.    [provider]  lactulose (CHRONULAC) 10 GM/15ML solution Take 30 g by mouth at bedtime.    [provider]  naltrexone (DEPADE) 50 MG tablet Take 25 mg by mouth 3 (three) times daily.    [provider]  omeprazole (PRILOSEC) 20 MG capsule Take 20 mg by mouth daily.    [provider]  psyllium (HYDROCIL/METAMUCIL) 95 % PACK Take 1 packet by mouth every morning.    [provider]    Family History History reviewed. No pertinent family history.  Social History Social History   Tobacco Use  . Smoking status: Never Smoker  . Smokeless tobacco: Never Used  Substance Use Topics  . Alcohol use: No  . Drug use: No     Allergies   Haldol [haloperidol]   Review of Systems Review of Systems  Unable to perform ROS: Other (Severe MR)     Physical Exam Updated Vital Signs BP (!) 150/76   Pulse (!) 110   Temp (!) 97.1 F (  36.2 C) (Axillary)   Resp 20   Ht 5\' 1"  (1.549 m)   Wt 49 kg   SpO2 100%   BMI 20.41 kg/m   Physical Exam  Constitutional: He appears well-developed and well-nourished. No distress.  HENT:  Head: Normocephalic and atraumatic.  Eyes: Right eye exhibits no discharge. Left eye exhibits no discharge.  Pulmonary/Chest: Effort normal. No respiratory distress.  Musculoskeletal:  Mild tenderness over the right knee without appreciable swelling, redness or warmth, no palpable deformities.  No lower leg swelling, full passive range of motion.  2+ DP and TP pulses and sensation intact.  Neurological: He is alert. Coordination normal.  Skin: Skin is warm and dry. Capillary refill takes less than 2 seconds. He is not  diaphoretic.  Psychiatric: He has a normal mood and affect. His behavior is normal.  Nursing note and vitals reviewed.    ED Treatments / Results  Labs (all labs ordered are listed, but only abnormal results are displayed) Labs Reviewed - No data to display  EKG None  Radiology Dg Knee Complete 4 Views Right  Result Date: 01/02/2018 CLINICAL DATA:  Knee swelling, difficulty flexing knee, special needs patient EXAM: RIGHT KNEE - COMPLETE 4+ VIEW COMPARISON:  None FINDINGS: Loss mineralization normal. Joint spaces preserved. No acute fracture, dislocation, or bone destruction. No knee joint effusion. IMPRESSION: Normal exam. Electronically Signed   By: Ulyses Southward M.D.   On: 01/02/2018 11:35    Procedures Procedures (including critical care time)  Medications Ordered in ED Medications  acetaminophen (TYLENOL) tablet 650 mg (650 mg Oral Given 01/02/18 1303)     Initial Impression / Assessment and Plan / ED Course  I have reviewed the triage vital signs and the nursing notes.  Pertinent labs & imaging results that were available during my care of the patient were reviewed by me and considered in my medical decision making (see chart for details).  Presents for evaluation of right knee pain, caregivers report that he has been complaining of some intermittent discomfort over the right knee, not noticed any swelling, redness and patient has not had any fevers.  They do report that he seems to be bending and extending the knee a bit less than usual.  They have not tried anything to treat this pain and he has not followed up with his PCP.  On exam there is no palpable swelling or deformity, no overlying erythema or skin changes.  The right leg is neurovascularly intact.  X-ray shows no evidence of fracture, dislocation, bony destruction or effusion.  Will have patient treat with knee sleeve, ice, heat and Tylenol and follow-up with his PCP in 1 week if symptoms are not improving.   Caregivers expressed understanding and are in agreement with plan.  Patient stable for discharge home.  Final Clinical Impressions(s) / ED Diagnoses   Final diagnoses:  Acute pain of right knee    ED Discharge Orders    None       Legrand Rams 01/02/18 1317    Azalia Bilis, MD 01/02/18 1521

## 2018-01-02 NOTE — ED Triage Notes (Signed)
Reports right knee swelling per care providers.

## 2019-10-23 IMAGING — DX DG KNEE COMPLETE 4+V*R*
4 series · 4 of 4 positions shown · non-contrast
Comparison: None

CLINICAL DATA: Knee swelling, difficulty flexing knee, special
needs patient

EXAM:
RIGHT KNEE - COMPLETE 4+ VIEW

[knee ap]
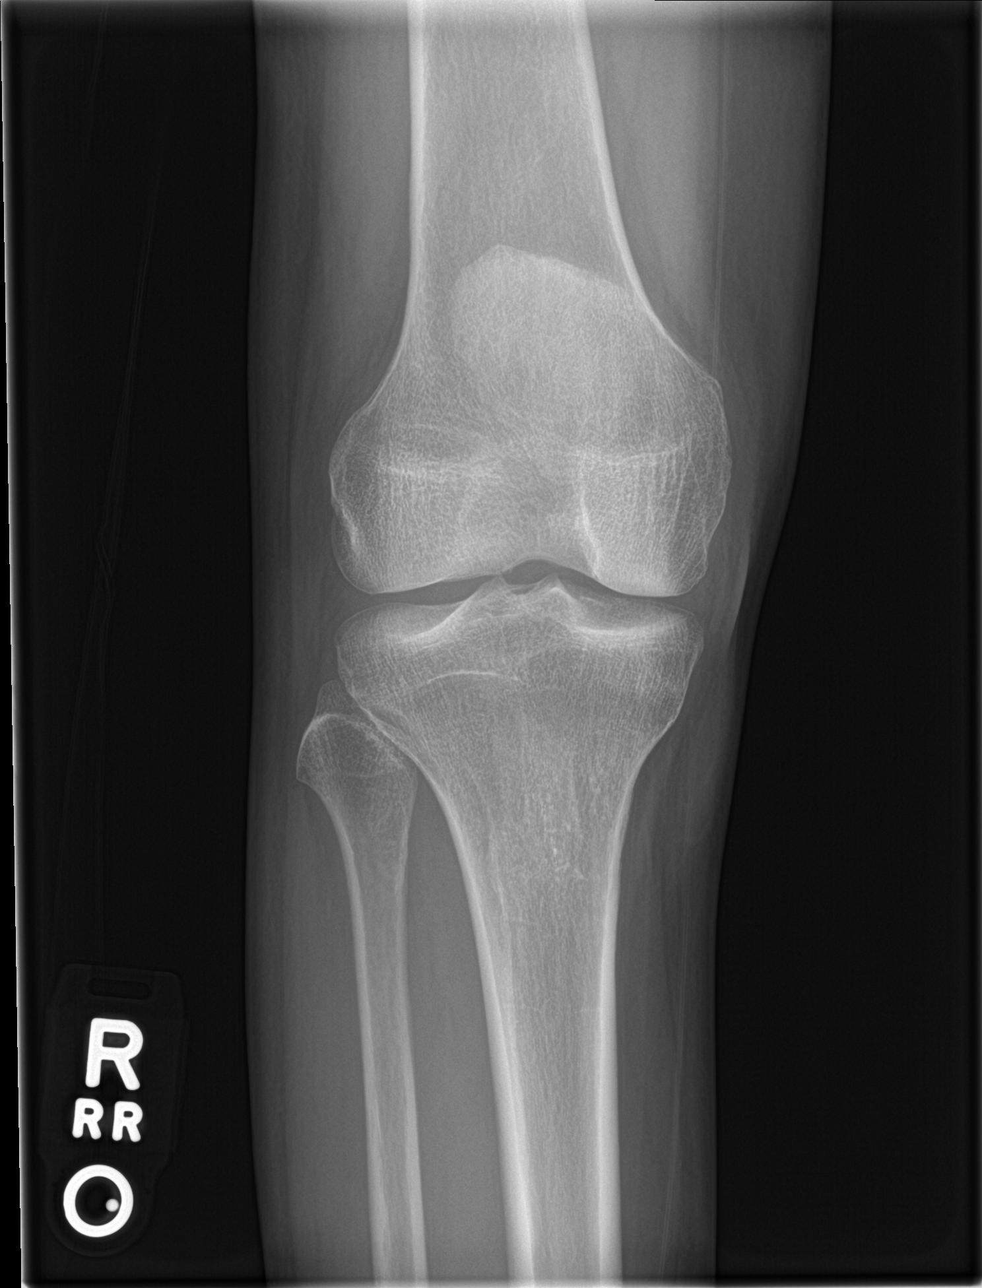

[knee lat]
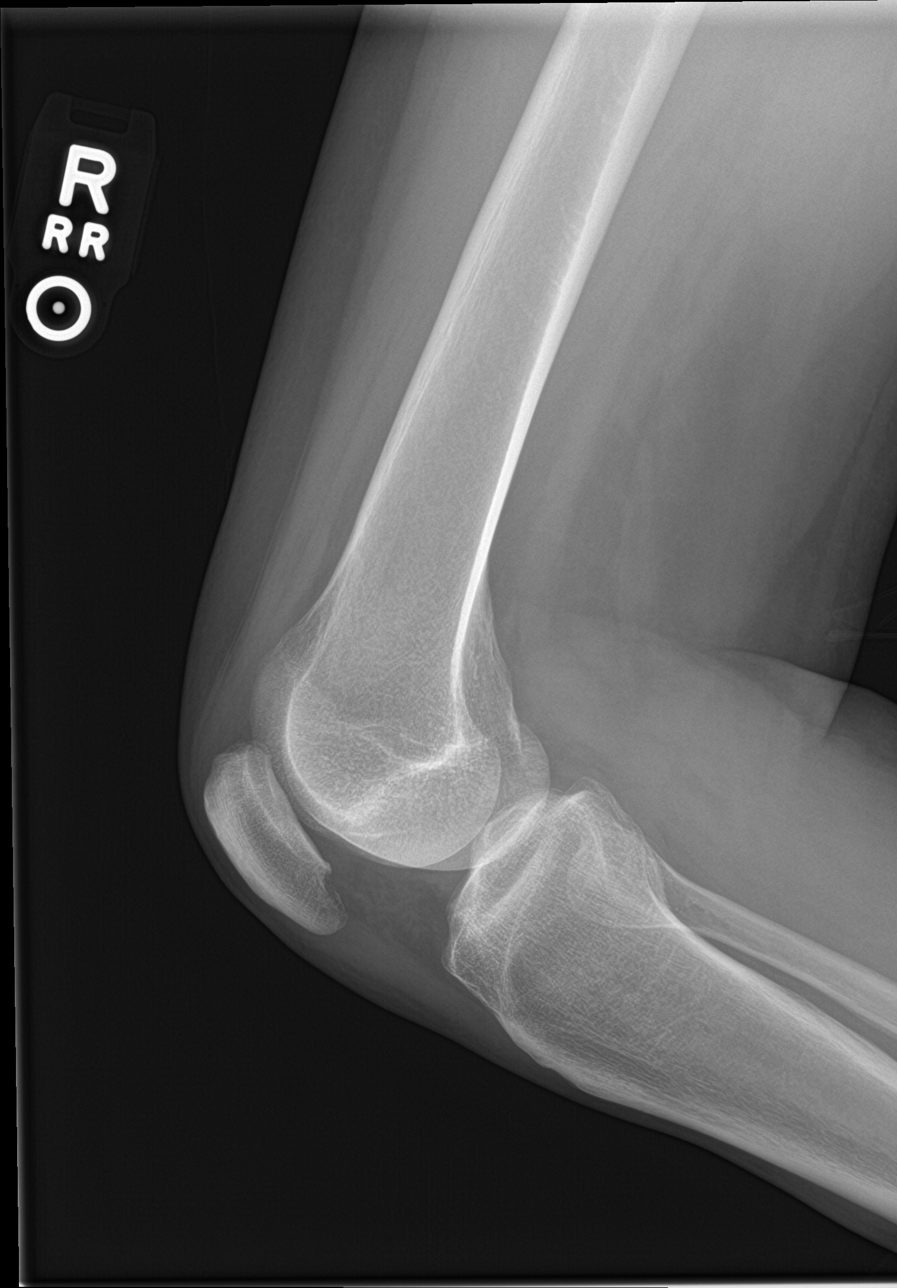

[knee obl (1 of 2)]
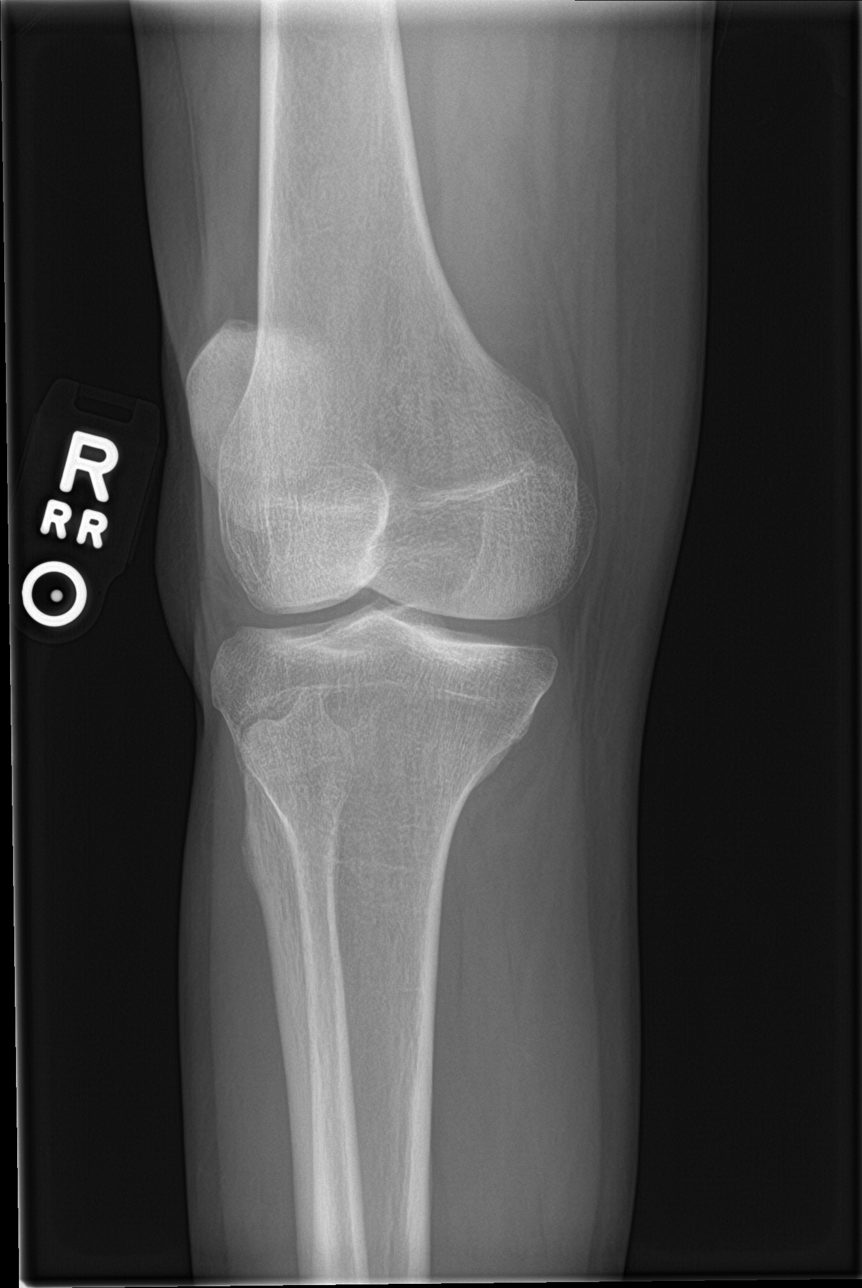

[knee obl (2 of 2)]
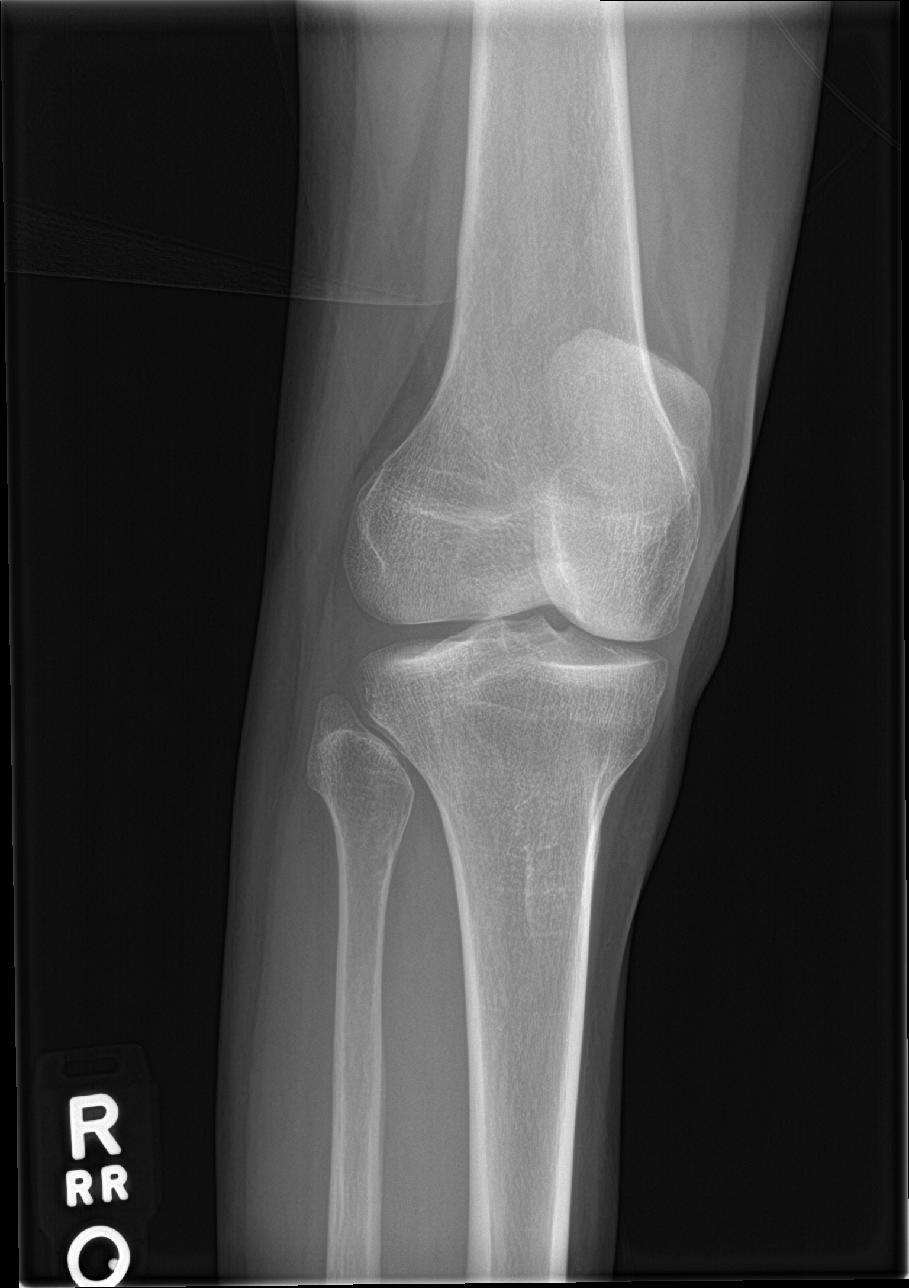

[4 of 4 positions shown; findings below may reference images not displayed]

FINDINGS: Loss mineralization normal.

Joint spaces preserved.

No acute fracture, dislocation, or bone destruction.

No knee joint effusion.
IMPRESSION: Normal exam.

## 2021-10-02 ENCOUNTER — Encounter (HOSPITAL_BASED_OUTPATIENT_CLINIC_OR_DEPARTMENT_OTHER): Payer: Self-pay

## 2021-10-02 ENCOUNTER — Emergency Department (HOSPITAL_BASED_OUTPATIENT_CLINIC_OR_DEPARTMENT_OTHER)
Admission: EM | Admit: 2021-10-02 | Discharge: 2021-10-02 | Disposition: A | Discharge status: Home / Self Care | Payer: Medicare Other | Attending: Emergency Medicine | Admitting: Emergency Medicine

## 2021-10-02 ENCOUNTER — Emergency Department (HOSPITAL_BASED_OUTPATIENT_CLINIC_OR_DEPARTMENT_OTHER): Payer: Medicare Other

## 2021-10-02 ENCOUNTER — Other Ambulatory Visit: Payer: Self-pay

## 2021-10-02 DIAGNOSIS — M7989 Other specified soft tissue disorders: Secondary | ICD-10-CM

## 2021-10-02 DIAGNOSIS — M79641 Pain in right hand: Secondary | ICD-10-CM | POA: Diagnosis not present

## 2021-10-02 DIAGNOSIS — R2231 Localized swelling, mass and lump, right upper limb: Secondary | ICD-10-CM | POA: Diagnosis present

## 2021-10-02 NOTE — ED Provider Notes (Signed)
Patient seen in conjunction with Schutt PA-C.  Patient has nondescript swelling of the right hand with negative x-rays which were personally reviewed and interpreted.  Agree no fracture.  Exam is difficult due to underlying developmental delay.  Patient has a tough time holding still.  It is impossible to discern any point tenderness due to cooperation.  I do not see any signs of cellulitis or abscess.  Do not see any signs of stings or bites.  No urticaria to suggest other allergic reaction.  No joint redness or swelling to suggest gout or other inflammatory arthritis, septic arthritis.  Patient has good range of motion.  At this point, agree with symptom control with Tylenol or ibuprofen.  May give Benadryl if desired.  Strict return precautions discussed including worsening redness, fever, streaking of the arm.  Caregiver at bedside verbalizes understanding agrees with plan.   Renne Crigler, PA-C 10/02/21 2351    Gwyneth Sprout, MD 10/05/21 1512

## 2021-10-02 NOTE — ED Notes (Signed)
Pt in no obvious distress. PA aware that staff were unable to complete a full set of VS on pt due to his MR. Multiple attempts were made. Pt unable to sign due to MR status. Caregiver from group home given discharge paperwork and instructions. Verbalized understanding.

## 2021-10-02 NOTE — ED Provider Notes (Signed)
[facility] EMERGENCY DEPARTMENT Provider Note   CSN: [id number] Arrival date & time: 10/02/21  2047     History  Chief Complaint  Patient presents with   Hand Pain    Fred Christensen is a 51 y.o. male.  Nonverbal who presents to the ED from his group home for evaluation of right hand swelling.  Patient care tech is present and states he noticed the pain at approximate 7 PM today when he was changing the patient's shirt.  He noticed after that that the patient was favoring his right arm and not using it quite as much as he typically does.  Patient care tech does not think there is any trauma to the area.  He has not seen any erythema or rash on the affected area.  Patient care tech States he has not been febrile today.   Hand Pain       Home Medications Prior to Admission medications   Medication Sig Start Date End Date Taking? Authorizing Provider  acetaminophen (TYLENOL) 325 MG tablet Take 650 mg by mouth every morning.    [provider]  acetaminophen (TYLENOL) 650 MG CR tablet Take 650 mg by mouth every evening.     [provider]  carbamazepine (TEGRETOL) 200 MG tablet Take 200 mg by mouth 2 (two) times daily.    [provider]  cholecalciferol (VITAMIN D) 1000 UNITS tablet Take 1,000 Units by mouth daily.    [provider]  fluticasone (FLONASE) 50 MCG/ACT nasal spray Place 2 sprays into the nose daily.    [provider]  gabapentin (NEURONTIN) 300 MG capsule Take 300 mg by mouth 2 (two) times daily.    [provider]  lactulose (CHRONULAC) 10 GM/15ML solution Take 30 g by mouth at bedtime.    [provider]  naltrexone (DEPADE) 50 MG tablet Take 25 mg by mouth 3 (three) times daily.    [provider]  omeprazole (PRILOSEC) 20 MG capsule Take 20 mg by mouth daily.    [provider]  psyllium (HYDROCIL/METAMUCIL) 95 % PACK Take 1 packet by mouth every morning.    [provider]      Allergies    Haldol [haloperidol]    Review of Systems   Review of Systems  Musculoskeletal:  Positive for joint swelling.  All other systems reviewed and are negative.   Physical Exam Updated Vital Signs Pulse 97   Ht 4\' 8"  (1.422 m)   Wt 59 kg   BMI 29.15 kg/m  Physical Exam Vitals and nursing note reviewed.  Constitutional:      General: He is not in acute distress.    Appearance: Normal appearance. He is normal weight. He is not ill-appearing.  HENT:     Head: Normocephalic and atraumatic.  Pulmonary:     Effort: Pulmonary effort is normal. No respiratory distress.  Abdominal:     General: Abdomen is flat.  Musculoskeletal:        General: Normal range of motion.     Right hand: Swelling present. No tenderness or bony tenderness. Normal range of motion. Normal capillary refill. Normal pulse.     Cervical back: Neck supple.     Comments: Right hand presents with diffuse swelling without erythema or rash.  Patient is able to open and close the hand is normal.  He does not follow directions.  Cap refill normal.  Radial pulses 2+ bilaterally.  Skin:    General: Skin  is warm and dry.  Neurological:     Mental Status: He is alert and oriented to person, place, and time.  Psychiatric:        Mood and Affect: Mood normal.        Behavior: Behavior normal.     ED Results / Procedures / Treatments   Labs (all labs ordered are listed, but only abnormal results are displayed) Labs Reviewed - No data to display  EKG None  Radiology DG Hand Complete Right  Result Date: 10/02/2021 CLINICAL DATA:  Hand swelling EXAM: RIGHT HAND - COMPLETE 3+ VIEW COMPARISON:  None Available. FINDINGS: There is no evidence of fracture or dislocation. There is no evidence of arthropathy or other focal bone abnormality. There is diffuse soft tissue swelling of the hand. IMPRESSION: 1. Diffuse soft tissue swelling of the hand. 2. No acute fracture or dislocation.  Electronically Signed   By: Darliss Cheney M.D.   On: 10/02/2021 22:34    Procedures Procedures    Medications Ordered in ED Medications - No data to display  ED Course/ Medical Decision Making/ A&P                           Medical Decision Making Amount and/or Complexity of Data Reviewed Radiology: ordered.  This patient presents to the ED for concern of right hand swelling, this involves an extensive number of treatment options, and is a complaint that carries with it a high risk of complications and morbidity.  The differential diagnosis includes cellulitis, allergic reaction, trauma, bony abnormality   Co morbidities that complicate the patient evaluation  Nonverbal  My initial workup includes  Additional history obtained from: Nursing notes from this visit. Caretaker present and provides the entirety of the history  I ordered imaging studies including x-ray right hand I independently visualized and interpreted imaging which showed soft tissue swelling with no bony abnormalities I agree with the radiologist interpretation  Afebrile, hemodynamically stable.  Right hand presents with diffuse swelling, no rash, no erythema, no warmth, no lesions.  Radial pulses 2+, cap refill normal, patient is able to open and close the hand. history difficult to obtain due to patient's nonverbal status and severe intellectual disability.  Patient care tech states that he has not had any injuries to the right hand that he is aware of and does not have any allergies.  Patient has had no known fevers.  I have low suspicion for cellulitis or other infectious process of the right hand.  Patient may have suffered some form of trauma, unable to determine due to patient nonverbal status. I have instructed patient care tech to closely follow the right hand and monitor for increasing swelling, redness, rash.  I have given patient care tech strict return precautions.  Stable at discharge.  At this time  there does not appear to be any evidence of an acute emergency medical condition and the patient appears stable for discharge with appropriate outpatient follow up. Diagnosis was discussed with patient who verbalizes understanding of care plan and is agreeable to discharge. I have discussed return precautions with patient and patient care provider who verbalizes understanding. Patient encouraged to follow-up with their PCP within 1 week. All questions answered.  Note: Portions of this report may have been transcribed using voice recognition software. Every effort was made to ensure accuracy; however, inadvertent computerized transcription errors may still be present.          Final  Clinical Impression(s) / ED Diagnoses Final diagnoses:  None    Rx / DC Orders ED Discharge Orders     None         Michelle Piper, Cordelia Poche 10/02/21 2340    Gwyneth Sprout, MD 10/05/21 1512

## 2021-10-02 NOTE — ED Triage Notes (Signed)
Pt coming in with staff from [facility] group home with RT hand swelling that they noticed today. Pt noted to be favoring his RT arm. No known injury to the area. Pt is non-verbal. Radial pulse intact 2+

## 2021-10-02 NOTE — Discharge Instructions (Signed)
You have been seen today for your complaint of right hand swelling. Your imaging was reassuring and showed no bony abnormalities. Home care instructions are as follows:  Patient may take ibuprofen for fevers or pain.  He should continue to monitor the right hand closely for increasing swelling, redness, warmth, rash. Follow up with: Your primary care provider in 1 week Please seek immediate medical care if you develop any of the following symptoms: Your hand becomes warm, red, or swollen. Your hand is numb or tingling. Your hand is extremely swollen or deformed. Your hand or fingers turn white or blue. You cannot move your hand, wrist, or fingers. At this time there does not appear to be the presence of an emergent medical condition, however there is always the potential for conditions to change. Please read and follow the below instructions.  Do not take your medicine if  develop an itchy rash, swelling in your mouth or lips, or difficulty breathing; call 911 and seek immediate emergency medical attention if this occurs.  You may review your lab tests and imaging results in their entirety on your MyChart account.  Please discuss all results of fully with your primary care provider and other specialist at your follow-up visit.  Note: Portions of this text may have been transcribed using voice recognition software. Every effort was made to ensure accuracy; however, inadvertent computerized transcription errors may still be present.

## 2021-10-06 ENCOUNTER — Emergency Department (HOSPITAL_BASED_OUTPATIENT_CLINIC_OR_DEPARTMENT_OTHER)
Admission: EM | Admit: 2021-10-06 | Discharge: 2021-10-06 | Disposition: A | Discharge status: Home / Self Care | Payer: Medicare Other | Attending: Emergency Medicine | Admitting: Emergency Medicine

## 2021-10-06 ENCOUNTER — Encounter (HOSPITAL_BASED_OUTPATIENT_CLINIC_OR_DEPARTMENT_OTHER): Payer: Self-pay

## 2021-10-06 ENCOUNTER — Emergency Department (HOSPITAL_BASED_OUTPATIENT_CLINIC_OR_DEPARTMENT_OTHER): Payer: Medicare Other

## 2021-10-06 ENCOUNTER — Other Ambulatory Visit: Payer: Self-pay

## 2021-10-06 DIAGNOSIS — S60221D Contusion of right hand, subsequent encounter: Secondary | ICD-10-CM

## 2021-10-06 DIAGNOSIS — M7989 Other specified soft tissue disorders: Secondary | ICD-10-CM | POA: Diagnosis present

## 2021-10-06 DIAGNOSIS — S60221A Contusion of right hand, initial encounter: Secondary | ICD-10-CM | POA: Diagnosis not present

## 2021-10-06 DIAGNOSIS — W228XXA Striking against or struck by other objects, initial encounter: Secondary | ICD-10-CM | POA: Insufficient documentation

## 2021-10-06 MED ORDER — PREDNISONE 10 MG PO TABS: 20.0000 mg | ORAL_TABLET | Freq: Every day | ORAL | 0 refills | Status: AC

## 2021-10-06 NOTE — ED Provider Notes (Signed)
[facility] EMERGENCY DEPARTMENT Provider Note   CSN: [id number] Arrival date & time: 10/06/21  1347     History  Chief Complaint  Patient presents with   Joint Swelling    Fred Christensen is a 51 y.o. male.  Level 5 caveat as patient is nonverbal.  Patient here for recheck of right hand swelling.  Had images done last week per caregiver.  They are not sure if swelling is gotten better.  Patient supposedly may have hit his hand last week.  Nothing has made it worse or better.  No fever.  He is acting at his baseline.  The history is provided by a caregiver.       Home Medications Prior to Admission medications   Medication Sig Start Date End Date Taking? Authorizing Provider  predniSONE (DELTASONE) 10 MG tablet Take 2 tablets (20 mg total) by mouth daily for 3 days. 10/06/21 10/09/21 Yes Germaine Ripp, DO  acetaminophen (TYLENOL) 325 MG tablet Take 650 mg by mouth every morning.    [provider]  acetaminophen (TYLENOL) 650 MG CR tablet Take 650 mg by mouth every evening.     [provider]  carbamazepine (TEGRETOL) 200 MG tablet Take 200 mg by mouth 2 (two) times daily.    [provider]  cholecalciferol (VITAMIN D) 1000 UNITS tablet Take 1,000 Units by mouth daily.    [provider]  fluticasone (FLONASE) 50 MCG/ACT nasal spray Place 2 sprays into the nose daily.    [provider]  gabapentin (NEURONTIN) 300 MG capsule Take 300 mg by mouth 2 (two) times daily.    [provider]  lactulose (CHRONULAC) 10 GM/15ML solution Take 30 g by mouth at bedtime.    [provider]  naltrexone (DEPADE) 50 MG tablet Take 25 mg by mouth 3 (three) times daily.    [provider]  omeprazole (PRILOSEC) 20 MG capsule Take 20 mg by mouth daily.    [provider]  psyllium (HYDROCIL/METAMUCIL) 95 % PACK Take 1 packet by mouth every morning.    [provider]      Allergies    Haldol  [haloperidol]    Review of Systems   Review of Systems  Physical Exam Updated Vital Signs BP 123/78 (BP Location: Right Arm)   Pulse (!) 102   Temp 98 F (36.7 C)   Resp 20   SpO2 98%  Physical Exam Vitals and nursing note reviewed.  Constitutional:      General: He is not in acute distress.    Appearance: He is well-developed.  HENT:     Head: Normocephalic and atraumatic.  Eyes:     Conjunctiva/sclera: Conjunctivae normal.  Cardiovascular:     Rate and Rhythm: Normal rate and regular rhythm.     Heart sounds: No murmur heard. Pulmonary:     Effort: Pulmonary effort is normal. No respiratory distress.     Breath sounds: Normal breath sounds.  Abdominal:     Palpations: Abdomen is soft.     Tenderness: There is no abdominal tenderness.  Musculoskeletal:        General: No swelling, tenderness or deformity. Normal range of motion.     Cervical back: Neck supple.     Comments: Overall there is no obvious swelling or deformity or redness or erythema to the right hand, hard to assess tenderness on exam  Skin:    General: Skin is warm and dry.     Capillary Refill:  Capillary refill takes less than 2 seconds.  Neurological:     General: No focal deficit present.     Mental Status: He is alert.     Sensory: No sensory deficit.     Motor: No weakness.  Psychiatric:        Mood and Affect: Mood normal.     ED Results / Procedures / Treatments   Labs (all labs ordered are listed, but only abnormal results are displayed) Labs Reviewed - No data to display  EKG None  Radiology No results found.  Procedures Procedures    Medications Ordered in ED Medications - No data to display  ED Course/ Medical Decision Making/ A&P                           Medical Decision Making Amount and/or Complexity of Data Reviewed Radiology: ordered.   Fred Christensen is here for recheck of right hand discomfort.  Patient is nonverbal.  Supposedly might of hit his hand last  week.  Not sure if he hit his hand again.  X-rays per my review last week were unremarkable.  On exam there does not appear to be any obvious deformity or swelling or redness to his right hand.  He seems to be moving all of his fingers and wrist without much discomfort.  My suspicion is that this is likely contusion.  X-ray was repeated today and per my review and interpretation there is no fracture or malalignment.  Overall suspect contusion.  Tylenol and ibuprofen it does not seem to help much per facility.  We will give a few days of prednisone to help with any inflammation.  Neurovascular neuromuscular patient is intact.  Patient discharged.  This chart was dictated using voice recognition software.  Despite best efforts to proofread,  errors can occur which can change the documentation meaning.         Final Clinical Impression(s) / ED Diagnoses Final diagnoses:  Contusion of right hand, subsequent encounter    Rx / DC Orders ED Discharge Orders          Ordered    predniSONE (DELTASONE) 10 MG tablet  Daily        10/06/21 1443              Virgina Norfolk, DO 10/06/21 1443

## 2021-10-06 NOTE — Discharge Instructions (Addendum)
No fracture seen on x-ray today.  Overall suspect a bone bruise.  I prescribed steroid to help with any inflammatory pain.  Recommend 1000 mg of Tylenol every 6 hours as needed for pain as well.

## 2021-10-06 NOTE — ED Triage Notes (Signed)
Patient arrived from [facility] group home with complaints of right wrist/hand swelling that has not improved - here for a recheck.   Patient is non-verbal at baseline

## 2021-12-30 ENCOUNTER — Encounter (HOSPITAL_BASED_OUTPATIENT_CLINIC_OR_DEPARTMENT_OTHER): Payer: Self-pay | Admitting: Pediatrics

## 2021-12-30 ENCOUNTER — Emergency Department (HOSPITAL_BASED_OUTPATIENT_CLINIC_OR_DEPARTMENT_OTHER): Payer: Medicare Other

## 2021-12-30 ENCOUNTER — Other Ambulatory Visit: Payer: Self-pay

## 2021-12-30 ENCOUNTER — Emergency Department (HOSPITAL_BASED_OUTPATIENT_CLINIC_OR_DEPARTMENT_OTHER)
Admission: EM | Admit: 2021-12-30 | Discharge: 2021-12-30 | Disposition: A | Discharge status: Home / Self Care | Payer: Medicare Other | Attending: Emergency Medicine | Admitting: Emergency Medicine

## 2021-12-30 ENCOUNTER — Emergency Department (HOSPITAL_BASED_OUTPATIENT_CLINIC_OR_DEPARTMENT_OTHER)
Admission: RE | Admit: 2021-12-30 | Discharge: 2021-12-30 | Disposition: A | Discharge status: Home / Self Care | Payer: Medicare Other | Source: Ambulatory Visit | Attending: Emergency Medicine | Admitting: Emergency Medicine

## 2021-12-30 DIAGNOSIS — R7401 Elevation of levels of liver transaminase levels: Secondary | ICD-10-CM | POA: Diagnosis not present

## 2021-12-30 DIAGNOSIS — R7989 Other specified abnormal findings of blood chemistry: Secondary | ICD-10-CM | POA: Diagnosis not present

## 2021-12-30 DIAGNOSIS — W1830XA Fall on same level, unspecified, initial encounter: Secondary | ICD-10-CM | POA: Insufficient documentation

## 2021-12-30 DIAGNOSIS — W19XXXA Unspecified fall, initial encounter: Secondary | ICD-10-CM

## 2021-12-30 DIAGNOSIS — S01111A Laceration without foreign body of right eyelid and periocular area, initial encounter: Secondary | ICD-10-CM | POA: Diagnosis not present

## 2021-12-30 DIAGNOSIS — R000 Tachycardia, unspecified: Secondary | ICD-10-CM | POA: Insufficient documentation

## 2021-12-30 DIAGNOSIS — S0181XA Laceration without foreign body of other part of head, initial encounter: Secondary | ICD-10-CM

## 2021-12-30 LAB — COMPREHENSIVE METABOLIC PANEL
ALT: 70 U/L — ABNORMAL HIGH (ref 0–44)
AST: 97 U/L — ABNORMAL HIGH (ref 15–41)
Albumin: 3.4 g/dL — ABNORMAL LOW (ref 3.5–5.0)
Alkaline Phosphatase: 203 U/L — ABNORMAL HIGH (ref 38–126)
Anion gap: 10 (ref 5–15)
BUN: 13 mg/dL (ref 6–20)
CO2: 22 mmol/L (ref 22–32)
Calcium: 8.8 mg/dL — ABNORMAL LOW (ref 8.9–10.3)
Chloride: 105 mmol/L (ref 98–111)
Creatinine, Ser: 1.06 mg/dL (ref 0.61–1.24)
GFR, Estimated: >60 mL/min (ref >60)
Glucose, Bld: 114 mg/dL — ABNORMAL HIGH (ref 70–99)
Potassium: 4.5 mmol/L (ref 3.5–5.1)
Sodium: 137 mmol/L (ref 135–145)
Total Bilirubin: 0.7 mg/dL (ref 0.3–1.2)
Total Protein: 7.6 g/dL (ref 6.5–8.1)

## 2021-12-30 LAB — CBC WITH DIFFERENTIAL/PLATELET
Abs Immature Granulocytes: 0.06 10*3/uL (ref 0.00–0.07)
Basophils Absolute: 0 10*3/uL (ref 0.0–0.1)
Basophils Relative: 0 %
Eosinophils Absolute: 0 10*3/uL (ref 0.0–0.5)
Eosinophils Relative: 0 %
HCT: 43.4 % (ref 39.0–52.0)
Hemoglobin: 14 g/dL (ref 13.0–17.0)
Immature Granulocytes: 1 %
Lymphocytes Relative: 9 %
Lymphs Abs: 0.7 10*3/uL (ref 0.7–4.0)
MCH: 28.1 pg (ref 26.0–34.0)
MCHC: 32.3 g/dL (ref 30.0–36.0)
MCV: 87.1 fL (ref 80.0–100.0)
Monocytes Absolute: 0.8 10*3/uL (ref 0.1–1.0)
Monocytes Relative: 11 %
Neutro Abs: 5.8 10*3/uL (ref 1.7–7.7)
Neutrophils Relative %: 79 %
Platelets: 312 10*3/uL (ref 150–400)
RBC: 4.98 MIL/uL (ref 4.22–5.81)
RDW: 13 % (ref 11.5–15.5)
WBC: 7.5 10*3/uL (ref 4.0–10.5)
nRBC: 0 % (ref 0.0–0.2)

## 2021-12-30 LAB — MAGNESIUM: Magnesium: 2 mg/dL (ref 1.7–2.4)

## 2021-12-30 MED ORDER — LACTATED RINGERS IV BOLUS
1000.0000 mL | Freq: Once | INTRAVENOUS | Status: AC
  Administered 2021-12-30: 1000 mL via INTRAVENOUS

## 2021-12-30 NOTE — ED Triage Notes (Signed)
Reported fall witnessed by another staff member from a group home. No report of LOC; denies use of blood thinners; sustained a laceration right eyebrow. Patient is accompanied by another caretaker. Patient is non verbal.

## 2021-12-30 NOTE — Discharge Instructions (Addendum)
Thank you for coming to [facility] Emergency Department. You were seen for fall, head injury. We did an exam, labs, and these showed a small laceration that was repaired with skin glue and Steri-Strips.  These will fall off on their own after approximately 7 days.  You can continue to wash her face but do not scrub at the Steri-Strip or peel it off..  You were also found to be tachycardic with high heart rate on presentation which resolved with fluids.  Please stay well-hydrated at home. Please follow up with your primary care provider within 1 week.   Do not hesitate to return to the ED or call 911 if you experience: -Worsening symptoms -Lightheadedness, passing out -Fevers/chills -Anything else that concerns you -Signs of infection include increased redness, swelling, pus drainage

## 2021-12-30 NOTE — ED Provider Notes (Signed)
[facility] EMERGENCY DEPARTMENT Provider Note   CSN: [id number] Arrival date & time: 12/30/21  1014     History  Chief Complaint  Patient presents with   Fred Christensen is a 51 y.o. 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   Reported mechanical fall witnessed by another staff member from a group home that occurred 0530 this AM. +head trauma. No report of LOC; denies use of blood thinners; sustained a laceration right eyebrow. Patient is accompanied by another caretaker. Patient is non verbal at baseline and is acting at his baseline by the caretaker who is at bedside. No emesis/nausea.   Patient denies pain anywhere. Denies neck pain, headache, or pain in his extremities. Is appropriately interactive per caretaker.    Fall       Home Medications Prior to Admission medications   Medication Sig Start Date End Date Taking? Authorizing Provider  acetaminophen (TYLENOL) 325 MG tablet Take 650 mg by mouth every morning.    [provider]  acetaminophen (TYLENOL) 650 MG CR tablet Take 650 mg by mouth every evening.     [provider]  carbamazepine (TEGRETOL) 200 MG tablet Take 200 mg by mouth 2 (two) times daily.    [provider]  cholecalciferol (VITAMIN D) 1000 UNITS tablet Take 1,000 Units by mouth daily.    [provider]  fluticasone (FLONASE) 50 MCG/ACT nasal spray Place 2 sprays into the nose daily.    [provider]  gabapentin (NEURONTIN) 300 MG capsule Take 300 mg by mouth 2 (two) times daily.    [provider]  lactulose (CHRONULAC) 10 GM/15ML solution Take 30 g by mouth at bedtime.    [provider]  naltrexone (DEPADE) 50 MG tablet Take 25 mg by mouth 3 (three) times daily.    [provider]  omeprazole (PRILOSEC) 20 MG capsule  Take 20 mg by mouth daily.    [provider]  psyllium (HYDROCIL/METAMUCIL) 95 % PACK Take 1 packet by mouth every morning.    [provider]      Allergies    Haldol [haloperidol]    Review of Systems   Review of Systems  Unable to perform ROS: Patient nonverbal    Physical Exam Updated Vital Signs BP 116/68 (BP Location: Right Arm)   Pulse (!) 101   Temp 98 F (36.7 C) (Axillary)   Resp 16   Ht _0  (1.626 m)   Wt 45.4 kg   SpO2 100%   BMI 17.16 kg/m  Physical Exam General: Thin- appearing male, lying in bed.  HEENT: PERRLA, EOMI, Sclera anicteric, MMM, trachea midline. 1.5 cm linear laceration to R eyebrow with surrounding ecchymosis. Forehead, midface, nasal bridge stable. Cardiology: RRR, no murmurs/rubs/gallops. BL radial and DP pulses equal bilaterally.  Resp: Normal respiratory rate and effort. CTAB, no wheezes, rhonchi, crackles.  Abd: Soft, non-tender, non-distended. No rebound tenderness or guarding.  GU: Deferred. MSK: No peripheral edema or signs of trauma. Extremities without deformity or TTP. No cyanosis or clubbing. Skin: warm, dry. No rashes or lesions. Back: No C, T, or L spine tenderness to palpation or stepoffs Neuro: Alert, nonverbal, unable to assess orientation but patient grunts in response to questions and follows most commands, CNs II-XII grossly intact. MAEs. Sensation grossly intact. Caregiver at bedside reports that he is acting at baseline  ED Results / Procedures /  Treatments   Labs (all labs ordered are listed, but only abnormal results are displayed) Labs Reviewed  COMPREHENSIVE METABOLIC PANEL - Abnormal; Notable for the following components:      Result Value   Glucose, Bld 114 (*)    Calcium 8.8 (*)    Albumin 3.4 (*)    AST 97 (*)    ALT 70 (*)    Alkaline Phosphatase 203 (*)    All other components within normal limits  CBC WITH DIFFERENTIAL/PLATELET  MAGNESIUM  URINALYSIS, ROUTINE W REFLEX MICROSCOPIC     EKG EKG Interpretation  Date/Time:  Thursday December 30 2021 11:22:39 EST Ventricular Rate:  110 PR Interval:  137 QRS Duration: 77 QT Interval:  327 QTC Calculation: 443 R Axis:   -76 Text Interpretation: Sinus tachycardia Left anterior fascicular block Abnormal R-wave progression, early transition Borderline T abnormalities, anterior leads Confirmed by Garnette Gunner [phone]) on 12/31/2021 10:21:07 AM  Radiology No results found.  Procedures .Marland KitchenLaceration Repair  Date/Time: 12/30/2021 1:45 PM  Performed by: Audley Hose, MD Authorized by: Audley Hose, MD   Consent:    Consent obtained:  Verbal   Consent given by:  Healthcare agent   Risks, benefits, and alternatives were discussed: yes     Risks discussed:  Infection, poor cosmetic result, poor wound healing, retained foreign body and nerve damage   Alternatives discussed:  No treatment Universal protocol:    Patient identity confirmed:  Arm band Anesthesia:    Anesthesia method:  None Laceration details:    Location:  Face   Face location:  R eyebrow   Length (cm):  1.5   Depth (mm):  3 Exploration:    Limited defect created (wound extended): yes     Hemostasis achieved with:  Direct pressure   Imaging outcome: foreign body not noted     Wound exploration: wound explored through full range of motion and entire depth of wound visualized     Wound extent: fascia not violated, no foreign body, no signs of injury, no nerve damage, no tendon damage, no underlying fracture and no vascular damage     Contaminated: no   Treatment:    Area cleansed with:  Saline   Amount of cleaning:  Standard   Irrigation solution:  Sterile saline   Irrigation volume:  50   Irrigation method:  Pressure wash   Visualized foreign bodies/material removed: no   Skin repair:    Repair method:  Steri-Strips and tissue adhesive   Number of Steri-Strips:  1 Approximation:    Approximation:  Close Repair type:    Repair type:   Simple Post-procedure details:    Dressing:  Open (no dressing)   Procedure completion:  Tolerated well, no immediate complications     Medications Ordered in ED Medications  lactated ringers bolus 1,000 mL (1,000 mLs Intravenous New Bag/Given 12/30/21 1218)    ED Course/ Medical Decision Making/ A&P                          Medical Decision Making Amount and/or Complexity of Data Reviewed Labs: ordered. Radiology: ordered.   This patient presents to the ED for concern of head trauma, this involves an extensive number of treatment options, and is a complaint that carries with it a high risk of complications and morbidity.  I considered the following differential and admission for this acute, potentially life threatening condition.  However patient is acting at his baseline per  the caretaker and has no focal neurodeficits on exam.  MDM:    DDX for trauma includes but is not limited to:  -Head Injury such as skull fx or ICH - Patient w/ facial trauma and he has been observed for many hours after fall. Will attempt to get CT scan of head given that he is nonverbal but he is well-appearing and acting at baseline. -Chest Injury and Abdominal Injury - No report of CP/abd pain, no TTP on exam -Spinal Cord or Vertebral injury - patient reports no neck pain, no C-spine tenderness or stepoffs on exam, patient observed to be utilizing FROM of neck without any prompting, very low c/f C-spine injury at this time -Fractures - no extremity TTP on exam, no signs of fracture   Small laceration is cleaned and repaired with skin glue and 1 steri strip. Informed health-aide at bedside to keep it clean and dry and that the steri strip will fall off on its own in ~7 days. Educated about signs of infection.    Clinical Course as of 12/30/21 1405  Thu Dec 30, 2021  1242 Patient was moving too much for the [facility]. It has been >7 hours since the fall and patient did not lose consciousness, no emesis, is  acting himself per the health aide at bedside who knows him well. He has no focal neuro deficits on exam and is appropriately interactive per his health aide. Do not believe we need to sedate patient for a CT scan at this time.  [HN]  1350 Labs demonstrate no leukocytosis or anemia.  Electrolytes okay.  Patient does have elevation in his alk phos AST ALT which has been present on labs in the past per Care Everywhere.  Patient is tachycardic into the 1 teens 120s initially.  After treatment with 1 L IV fluid patient with heart rate in the 90s.  Given pattern of LFT elevation, consider alcohol withdrawal with vital sign abnormalities but tachycardia responded to fluid and patient has no hypertension, tremor, nausea vomiting, or other symptoms of alcohol withdrawal. [HN]  1403 HR 91 bpm [HN]  1404 Has been tachycardic to 90-100 bpm in the past on prior visits. Health aide at bedside states that patient's pulse normally is around that rate. Patient appears well. Will be DC'd into care of health aide and group home. Instructed to f/u with PCP within 1-2 weeks. [HN]    Clinical Course User Index [HN] Audley Hose, MD   Labs I have independently reviewed labs and the pertinent results include: Elevated AST/ALT 97/70, Alk phos 203, which have been elevated in the past as well.   Imaging Studies ordered: I ordered imaging studies including CTH. Unable to be performed due to   Additional history obtained from caregiver at bedside  Cardiac Monitoring: The patient was maintained on a cardiac monitor.  I personally viewed and interpreted the cardiac monitored which showed an underlying rhythm of: ST  Reevaluation: After the interventions noted above, I reevaluated the patient and found that they have :improved  Social Determinants of Health: Patient lives in group home  Disposition:  DC back to group home w/ DC instructions/return precautions.  Co morbidities that complicate the patient  evaluation  Past Medical History:  Diagnosis Date   Arthritis    Profound mental retardation      Medicines Meds ordered this encounter  Medications   lactated ringers bolus 1,000 mL    I have reviewed the patients home medicines and have made adjustments  as needed  Problem List / ED Course: Problem List Items Addressed This Visit   None Visit Diagnoses     Fall, initial encounter    -  Primary   Facial laceration, initial encounter       Elevated LFTs                    This note was created using dictation software, which may contain spelling or grammatical errors.    Audley Hose, MD 01/06/22 [phone]

## 2023-03-28 ENCOUNTER — Encounter: Payer: Self-pay | Admitting: Gastroenterology

## 2023-05-16 ENCOUNTER — Other Ambulatory Visit (INDEPENDENT_AMBULATORY_CARE_PROVIDER_SITE_OTHER)

## 2023-05-16 ENCOUNTER — Encounter: Payer: Self-pay | Admitting: Gastroenterology

## 2023-05-16 ENCOUNTER — Ambulatory Visit (INDEPENDENT_AMBULATORY_CARE_PROVIDER_SITE_OTHER): Admitting: Gastroenterology

## 2023-05-16 VITALS — BP 122/68 | HR 124 | Ht 64.0 in | Wt 103.0 lb

## 2023-05-16 DIAGNOSIS — K5909 Other constipation: Secondary | ICD-10-CM | POA: Diagnosis not present

## 2023-05-16 DIAGNOSIS — Z1211 Encounter for screening for malignant neoplasm of colon: Secondary | ICD-10-CM

## 2023-05-16 DIAGNOSIS — R7989 Other specified abnormal findings of blood chemistry: Secondary | ICD-10-CM

## 2023-05-16 DIAGNOSIS — R7401 Elevation of levels of liver transaminase levels: Secondary | ICD-10-CM

## 2023-05-16 DIAGNOSIS — R748 Abnormal levels of other serum enzymes: Secondary | ICD-10-CM

## 2023-05-16 DIAGNOSIS — F73 Profound intellectual disabilities: Secondary | ICD-10-CM

## 2023-05-16 LAB — CBC WITH DIFFERENTIAL/PLATELET
Basophils Absolute: 0.1 10*3/uL (ref 0.0–0.1)
Basophils Relative: 0.5 % (ref 0.0–3.0)
Eosinophils Absolute: 0.1 10*3/uL (ref 0.0–0.7)
Eosinophils Relative: 0.5 % (ref 0.0–5.0)
HCT: 44.9 % (ref 39.0–52.0)
Hemoglobin: 14.9 g/dL (ref 13.0–17.0)
Lymphocytes Relative: 18.5 % (ref 12.0–46.0)
Lymphs Abs: 1.9 10*3/uL (ref 0.7–4.0)
MCHC: 33.2 g/dL (ref 30.0–36.0)
MCV: 86.8 fl (ref 78.0–100.0)
Monocytes Absolute: 0.8 10*3/uL (ref 0.1–1.0)
Monocytes Relative: 7.7 % (ref 3.0–12.0)
Neutro Abs: 7.6 10*3/uL (ref 1.4–7.7)
Neutrophils Relative %: 72.8 % (ref 43.0–77.0)
Platelets: 449 10*3/uL — ABNORMAL HIGH (ref 150.0–400.0)
RBC: 5.18 Mil/uL (ref 4.22–5.81)
RDW: 13.5 % (ref 11.5–15.5)
WBC: 10.4 10*3/uL (ref 4.0–10.5)

## 2023-05-16 LAB — COMPREHENSIVE METABOLIC PANEL WITH GFR
ALT: 80 U/L — ABNORMAL HIGH (ref 0–53)
AST: 51 U/L — ABNORMAL HIGH (ref 0–37)
Albumin: 4.2 g/dL (ref 3.5–5.2)
Alkaline Phosphatase: 268 U/L — ABNORMAL HIGH (ref 39–117)
BUN: 14 mg/dL (ref 6–23)
CO2: 28 meq/L (ref 19–32)
Calcium: 9.6 mg/dL (ref 8.4–10.5)
Chloride: 101 meq/L (ref 96–112)
Creatinine, Ser: 0.93 mg/dL (ref 0.40–1.50)
GFR: 94.51 mL/min (ref >60.00)
Glucose, Bld: 106 mg/dL — ABNORMAL HIGH (ref 70–99)
Potassium: 3.7 meq/L (ref 3.5–5.1)
Sodium: 139 meq/L (ref 135–145)
Total Bilirubin: 0.3 mg/dL (ref 0.2–1.2)
Total Protein: 8.4 g/dL — ABNORMAL HIGH (ref 6.0–8.3)

## 2023-05-16 NOTE — Patient Instructions (Signed)
 Your provider has requested that you go to the basement level for lab work before leaving today. Press "B" on the elevator. The lab is located at the first door on the left as you exit the elevator.  Please follow up as needed if symptoms increase or worsen  Due to recent changes in healthcare laws, you may see the results of your imaging and laboratory studies on MyChart before your provider has had a chance to review them.  We understand that in some cases there may be results that are confusing or concerning to you. Not all laboratory results come back in the same time frame and the provider may be waiting for multiple results in order to interpret others.  Please give us  48 hours in order for your provider to thoroughly review all the results before contacting the office for clarification of your results.   _______________________________________________________  If your blood pressure at your visit was 140/90 or greater, please contact your primary care physician to follow up on this.  _______________________________________________________  If you are age 53 or older, your body mass index should be between 23-30. Your Body mass index is 17.68 kg/m. If this is out of the aforementioned range listed, please consider follow up with your Primary Care Provider.  If you are age 24 or younger, your body mass index should be between 19-25. Your Body mass index is 17.68 kg/m. If this is out of the aformentioned range listed, please consider follow up with your Primary Care Provider.   ________________________________________________________  The [facility] providers would like to encourage you to use MYCHART to communicate with providers for non-urgent requests or questions.  Due to long hold times on the telephone, sending your provider a message by [facility] may be a faster and more efficient way to get a response.  Please allow 48 business hours for a response.  Please remember that this is for  non-urgent requests.  _______________________________________________________ Thank you for trusting me with your gastrointestinal care!   Suzanna Erp, PA

## 2023-05-16 NOTE — Progress Notes (Addendum)
 Chief Complaint: Elevated LFTs Primary GI MD: Para Bold  HPI: 53 year old male history of intellectual disability presents with health care services coordinator from a group home for evaluation of elevated LFTs  CMP 12/2021 with AST 97/ALT 70/alk phos 203.  T. bili 0.7.  CBC with normal platelets.  Repeat labs with [facility] Everywhere 12/2022 show AST 48/ALT 62/alk phos 265.  US  abdomen complete January 2023 showed gallbladder not visualized secondary to shadowing representing bowel gas or contracted gallbladder filled with stones.    Appears patient had US  abdomen complete ordered by Dr. Denece Finger in March 2000 for abnormal liver function tests.  This demonstrated numerous gallstones with normal common bile duct and normal liver parenchyma  No history of alcohol use.  Unknown family history of liver disease.  Patient has history of intellectual disability and lives in a group home and requires around-the-clock caregiver.  He is not wheelchair-bound and follows commands adequately but is overall nonverbal  Health care services coordinator that is with him and provides history today states he is on lactulose for chronic constipation.  States lactulose and psyllium husk works well for his constipation and he typically has large bowel movements that are soft and formed without difficulty and without bleeding.  Patient has extreme anxiety with medical procedures and has had to be medicated in the past to undergo certain procedures.  Unknown family history of colon cancer.  No previous colonoscopy or colon cancer screening.  Health care services coordinator with him today states they are unsure if they want to move forward with colon cancer screening because if a stool study were to be positive they do not feel he would be able to undergo colonoscopy or performed colon prep adequately    Past Medical History:  Diagnosis Date   Arthritis    Profound mental retardation     No past surgical  history on file.  Current Outpatient Medications  Medication Sig Dispense Refill   acetaminophen  (TYLENOL ) 325 MG tablet Take 650 mg by mouth every morning.     acetaminophen  (TYLENOL ) 650 MG CR tablet Take 650 mg by mouth every evening.      carbamazepine (TEGRETOL) 200 MG tablet Take 200 mg by mouth 2 (two) times daily.     cholecalciferol (VITAMIN D) 1000 UNITS tablet Take 1,000 Units by mouth daily.     fluticasone (FLONASE) 50 MCG/ACT nasal spray Place 2 sprays into the nose daily.     gabapentin (NEURONTIN) 300 MG capsule Take 300 mg by mouth 2 (two) times daily.     lactulose (CHRONULAC) 10 GM/15ML solution Take 30 g by mouth at bedtime.     naltrexone (DEPADE) 50 MG tablet Take 25 mg by mouth 3 (three) times daily.     omeprazole (PRILOSEC) 20 MG capsule Take 20 mg by mouth daily.     psyllium (HYDROCIL/METAMUCIL) 95 % PACK Take 1 packet by mouth every morning.     No current facility-administered medications for this visit.    Allergies as of 05/16/2023 - Review Complete 05/16/2023  Allergen Reaction Noted   Haldol  [haloperidol ] Other (See Comments) 08/12/2012    No family history on file.  Social History   Socioeconomic History   Marital status: Single    Spouse name: Not on file   Number of children: Not on file   Years of education: Not on file   Highest education level: Not on file  Occupational History   Not on file  Tobacco Use  Smoking status: Never   Smokeless tobacco: Never  Vaping Use   Vaping status: Never Used  Substance and Sexual Activity   Alcohol use: No   Drug use: No   Sexual activity: Not on file  Other Topics Concern   Not on file  Social History Narrative   Not on file   Social Drivers of Health   Financial Resource Strain: Low Risk  (12/26/2022)   Received from [facility]   Overall Financial Resource Strain (CARDIA)    Difficulty of Paying Living Expenses: Not hard at all  Food Insecurity: No Food Insecurity (12/26/2022)    Received from [facility]   Hunger Vital Sign    Worried About Running Out of Food in the Last Year: Never true    Ran Out of Food in the Last Year: Never true  Transportation Needs: No Transportation Needs (12/26/2022)   Received from [facility] - Transportation    Lack of Transportation (Medical): No    Lack of Transportation (Non-Medical): No  Physical Activity: Unknown (12/26/2022)   Received from [facility]   Exercise Vital Sign    Days of Exercise per Week: 0 days    Minutes of Exercise per Session: Not on file  Stress: No Stress Concern Present (12/26/2022)   Received from [facility] - Occupational Stress Questionnaire    Feeling of Stress : Not at all  Social Connections: Socially Integrated (12/26/2022)   Received from [facility]   Social Network    How would you rate your social network (family, work, friends)?: Good participation with social networks  Intimate Partner Violence: Not At Risk (12/26/2022)   Received from [facility]   HITS    Over the last 12 months how often did your partner physically hurt you?: Never    Over the last 12 months how often did your partner insult you or talk down to you?: Never    Over the last 12 months how often did your partner threaten you with physical harm?: Never    Over the last 12 months how often did your partner scream or curse at you?: Never    Review of Systems:    Constitutional: No weight loss, fever, chills, weakness or fatigue HEENT: Eyes: No change in vision               Ears, Nose, Throat:  No change in hearing or congestion Skin: No rash or itching Cardiovascular: No chest pain, chest pressure or palpitations   Respiratory: No SOB or cough Gastrointestinal: See HPI and otherwise negative Genitourinary: No dysuria or change in urinary frequency Neurological: No headache, dizziness or syncope Musculoskeletal: No new muscle or joint pain Hematologic: No  bleeding or bruising Psychiatric: No history of depression or anxiety    Physical Exam:  Vital signs: BP 122/68   Pulse (!) 124   Ht 5\' 4"  (1.626 m)   Wt 103 lb (46.7 kg)   SpO2 99%   BMI 17.68 kg/m   Constitutional: NAD, alert and cooperative Head:  Normocephalic and atraumatic. Eyes:   PEERL, EOMI. No icterus. Conjunctiva pink. Respiratory: Respirations even and unlabored. Lungs clear to auscultation bilaterally.   No wheezes, crackles, or rhonchi.  Cardiovascular: Tachycardic, regular rhythm Gastrointestinal:  Soft, nondistended, nontender. No rebound or guarding. Normal bowel sounds. No appreciable masses or hepatomegaly. Rectal:  Declines Msk:  Symmetrical without gross deformities. Without edema, no deformity or joint abnormality.  Neurologic:  Alert and  oriented x4;  grossly normal neurologically.  Skin:   Dry and intact without significant lesions or rashes. Psychiatric: Able to follow majority of commands, unable to answer questions or provide history   RELEVANT LABS AND IMAGING: CBC    Component Value Date/Time   WBC 7.5 12/30/2021 1150   RBC 4.98 12/30/2021 1150   HGB 14.0 12/30/2021 1150   HCT 43.4 12/30/2021 1150   PLT 312 12/30/2021 1150   MCV 87.1 12/30/2021 1150   MCH 28.1 12/30/2021 1150   MCHC 32.3 12/30/2021 1150   RDW 13.0 12/30/2021 1150   LYMPHSABS 0.7 12/30/2021 1150   MONOABS 0.8 12/30/2021 1150   EOSABS 0.0 12/30/2021 1150   BASOSABS 0.0 12/30/2021 1150    CMP     Component Value Date/Time   NA 137 12/30/2021 1150   K 4.5 12/30/2021 1150   CL 105 12/30/2021 1150   CO2 22 12/30/2021 1150   GLUCOSE 114 (H) 12/30/2021 1150   BUN 13 12/30/2021 1150   CREATININE 1.06 12/30/2021 1150   CALCIUM 8.8 (L) 12/30/2021 1150   PROT 7.6 12/30/2021 1150   ALBUMIN 3.4 (L) 12/30/2021 1150   AST 97 (H) 12/30/2021 1150   ALT 70 (H) 12/30/2021 1150   ALKPHOS 203 (H) 12/30/2021 1150   BILITOT 0.7 12/30/2021 1150   GFRNONAA >60 12/30/2021 1150      Assessment/Plan:   53 year old male with history of intellectual disability presents with caregiver for evaluation of elevated LFTs that were drawn in December 2023  Elevated LFTs CMP 12/2022 with AST 48/ALT 62/alk phos 265.  CBC with normal platelets.  US  abdomen complete January 2023 with gallbladder not visualized secondary to shadowing could represent bowel gas or contracted gallbladder filled with stones.  Normal liver.  Ultrasound abdomen complete in 2000 showed multiple gallstones.  Suspect with predominantly elevated alk phos and presence of stone his elevated LFTs are in the setting of significant cholelithiasis.  No current symptoms other than elevation in liver enzymes. - CBC/CMP today - If continued elevation in LFTs suspect secondary to cholelithiasis with 2 previous ultrasounds showing possible significant cholelithiasis.  Consider referral to [facility] based on labs.  Chronic constipation Well-managed with psyllium and lactulose  Colon cancer screening Caregiver present with him today declined colon cancer screening.  Patient would have difficulty prepping with his cognitive dysfunction and has extreme anxiety with health procedures and will need to be sedated prior to the procedure in order to undergo a colonoscopy so caregiver opted to declined any type of colon cancer screening today. - Advised if red flag symptoms such as rectal bleeding, weight loss, change in bowel habits occur to please let us  know  Assigned to Dr. Bridgett Camps today  Suzanna Erp, PA-C [facility] 05/16/2023, 11:40 AM  Cc: Emaline Handsome, MD

## 2023-05-17 ENCOUNTER — Other Ambulatory Visit: Payer: Self-pay | Admitting: *Deleted

## 2023-05-17 DIAGNOSIS — R7989 Other specified abnormal findings of blood chemistry: Secondary | ICD-10-CM

## 2023-05-17 DIAGNOSIS — R945 Abnormal results of liver function studies: Secondary | ICD-10-CM

## 2023-05-17 NOTE — Addendum Note (Signed)
 Addended by: Glennette Lanius on: 05/17/2023 11:25 AM   Modules accepted: Orders

## 2023-05-18 ENCOUNTER — Other Ambulatory Visit: Payer: Self-pay | Admitting: *Deleted

## 2023-05-18 NOTE — Addendum Note (Signed)
 Addended by: Glennette Lanius on: 05/18/2023 03:54 PM   Modules accepted: Orders

## 2023-05-18 NOTE — Progress Notes (Signed)
 Note reviewed and discussed with Dottie Smith, RN as we have now learned that he is status post cholecystectomy performed after the ultrasound in 2023 He has persistently elevated alkaline phosphatase but also mild elevation in serum AST and ALT The chart indicates he has also had previous ERCP presumed for choledocholithiasis  In this setting I would recommend the following to further evaluate his liver enzymes: Repeat hepatic function panel, INR, ANA, IgG, smooth muscle antibody, mitochondrial antibody, hepatitis A total antibody, hepatitis B surface antigen, hepatitis B surface antibody, hepatitis B core total antibody, hepatitis C antibody, ferritin plus IBC I would also recommend given his history of gallbladder stones and prior ERCP that we repeat abdominal imaging with MRI plus MRCP if patient and caregiver agreeable

## 2023-05-18 NOTE — Progress Notes (Signed)
 See lab result notes dated 05/16/23.

## 2023-05-24 ENCOUNTER — Telehealth: Payer: Self-pay | Admitting: Gastroenterology

## 2023-05-24 DIAGNOSIS — R7989 Other specified abnormal findings of blood chemistry: Secondary | ICD-10-CM

## 2023-05-24 NOTE — Telephone Encounter (Signed)
 Inbound call from [facility] requesting a call back from the nurse. Fredrik Jensen states that patient was scheduled for [facility] tomorrow for MRCP and the appointment was canceled. Requesting a call back to discuss further. Please advise.

## 2023-05-24 NOTE — Telephone Encounter (Signed)
 Spoke to [place] at patient's group home. She states that they cancelled MRCP as previously scheduled for tomorrow because they did not receive lorazepam rx sent on 4/24 to [facility].   I have sent a new lorazepam rx to [facility] to sign and will rearrange MRCP

## 2023-05-25 ENCOUNTER — Ambulatory Visit (HOSPITAL_COMMUNITY)

## 2023-05-25 MED ORDER — LORAZEPAM 2 MG PO TABS: 2.0000 mg | ORAL_TABLET | Freq: Four times a day (QID) | ORAL | 0 refills | Status: DC | PRN

## 2023-05-25 NOTE — Telephone Encounter (Signed)
 Done

## 2023-05-25 NOTE — Telephone Encounter (Signed)
 Inbound call from [facility] in regards to [place], requesting a call back at [phone].   Needing prescription clarification

## 2023-05-25 NOTE — Telephone Encounter (Signed)
 Clarified for [facility] that lorazepam  rx was only for one time dosing prior to MRCP.   Rescheduled MRCP to Friday, 06/02/23 at 10 am, 930 am arrival.  I have spoken to [place] at patient's group home to make her aware or rescheduled MRCP and that we have resent rx for lorazepam . She verbalizes understanding and indicates that they will get patient to 06/02/23 appointment. She is asked to contact me if she has not received lorazepam  rx within the next couple of days.

## 2023-06-02 ENCOUNTER — Telehealth: Payer: Self-pay | Admitting: Gastroenterology

## 2023-06-02 ENCOUNTER — Ambulatory Visit (HOSPITAL_COMMUNITY)
Admission: RE | Admit: 2023-06-02 | Discharge: 2023-06-02 | Disposition: A | Discharge status: Home / Self Care | Source: Ambulatory Visit | Attending: Gastroenterology | Admitting: Gastroenterology

## 2023-06-02 ENCOUNTER — Encounter (HOSPITAL_COMMUNITY): Payer: Self-pay

## 2023-06-02 ENCOUNTER — Other Ambulatory Visit: Payer: Self-pay | Admitting: Gastroenterology

## 2023-06-02 DIAGNOSIS — R7989 Other specified abnormal findings of blood chemistry: Secondary | ICD-10-CM

## 2023-06-02 DIAGNOSIS — R945 Abnormal results of liver function studies: Secondary | ICD-10-CM

## 2023-06-02 NOTE — Telephone Encounter (Signed)
 Received message from [facility] radiology.  "This pt is unable to hold still for the duration of the MRI, which is 25-30 mins. Even premedicated he was unable to hold still. You could call [facility] and see if they can recommend another modality."  Any suggestions for next steps with this patient? Difficult situations but unsure which option to proceed with. I am sure you have an additional recommendation that could be more useful but here are a few I was thinking if useful. 1) sedate/anesthesia for MRCP? 2) obtain CT (though less specific) 3) go straight for ERCP since he cannot tolerate imaging

## 2023-06-02 NOTE — Telephone Encounter (Signed)
 He needs to come back for the requested lab work  Put MRI/MRCP on hold for now until we see the lab work EUS may be a consideration in the future if we need to reexamine the bile duct

## 2023-06-06 NOTE — Telephone Encounter (Signed)
 Left message for Fredrik Jensen (patient caregiver at his group home) advising that we will hold off on repeat imaging for the time being but do still need patient to come back to our office for additional blood work. Advised of available times/dates for patient to come and no appointment will be necessary. Asked that Fredrik Jensen call back if needed.

## 2023-06-16 NOTE — Telephone Encounter (Signed)
 I have again left a voicemail for [place] requesting patient have labwork completed at our office. Again, gave times/dates/location for lab and asked that she call back if any questions.

## 2023-06-21 ENCOUNTER — Other Ambulatory Visit (INDEPENDENT_AMBULATORY_CARE_PROVIDER_SITE_OTHER)

## 2023-06-21 DIAGNOSIS — R945 Abnormal results of liver function studies: Secondary | ICD-10-CM

## 2023-06-21 DIAGNOSIS — R7989 Other specified abnormal findings of blood chemistry: Secondary | ICD-10-CM | POA: Diagnosis not present

## 2023-06-21 LAB — PROTIME-INR
INR: 1.2 ratio — ABNORMAL HIGH (ref 0.8–1.0)
Prothrombin Time: 12.3 s (ref 9.6–13.1)

## 2023-06-21 LAB — HEPATIC FUNCTION PANEL
ALT: 78 U/L — ABNORMAL HIGH (ref 0–53)
AST: 51 U/L — ABNORMAL HIGH (ref 0–37)
Albumin: 4 g/dL (ref 3.5–5.2)
Alkaline Phosphatase: 318 U/L — ABNORMAL HIGH (ref 39–117)
Bilirubin, Direct: 0.1 mg/dL (ref 0.0–0.3)
Total Bilirubin: 0.3 mg/dL (ref 0.2–1.2)
Total Protein: 8 g/dL (ref 6.0–8.3)

## 2023-06-27 ENCOUNTER — Telehealth: Payer: Self-pay | Admitting: Gastroenterology

## 2023-06-27 ENCOUNTER — Ambulatory Visit: Payer: Self-pay | Admitting: Gastroenterology

## 2023-06-27 ENCOUNTER — Other Ambulatory Visit: Payer: Self-pay | Admitting: Internal Medicine

## 2023-06-27 DIAGNOSIS — R748 Abnormal levels of other serum enzymes: Secondary | ICD-10-CM

## 2023-06-27 DIAGNOSIS — R4589 Other symptoms and signs involving emotional state: Secondary | ICD-10-CM

## 2023-06-27 DIAGNOSIS — R7989 Other specified abnormal findings of blood chemistry: Secondary | ICD-10-CM

## 2023-06-27 DIAGNOSIS — R945 Abnormal results of liver function studies: Secondary | ICD-10-CM

## 2023-06-27 MED ORDER — LORAZEPAM 1 MG PO TABS: 1.0000 mg | ORAL_TABLET | Freq: Three times a day (TID) | ORAL | 0 refills | Status: DC

## 2023-06-27 NOTE — Progress Notes (Signed)
 Ativan  1 mg x 1 sent to [facility]

## 2023-06-27 NOTE — Telephone Encounter (Signed)
 Inbound call from [facility] want to confirm quantity for 1 tablet of Ativan . Stated instructions stated 1 tablet every 8 hours. Advised representative of 6/3 Results Follow-up visit note stating 1 tablet would be given to patient 1-2 hours prior to ultrasound.

## 2023-07-01 LAB — HEPATITIS C ANTIBODY: Hepatitis C Ab: NONREACTIVE

## 2023-07-01 LAB — MITOCHONDRIAL ANTIBODIES: Mitochondrial M2 Ab, IgG: <=20 U (ref <=20.0)

## 2023-07-01 LAB — HEPATITIS A ANTIBODY, TOTAL: Hepatitis A AB,Total: NONREACTIVE

## 2023-07-01 LAB — HEPATITIS B SURFACE ANTIBODY,QUALITATIVE: Hep B S Ab: NONREACTIVE

## 2023-07-01 LAB — ANA

## 2023-07-01 LAB — HEPATITIS B CORE ANTIBODY, TOTAL: Hep B Core Total Ab: NONREACTIVE

## 2023-07-01 LAB — IGG: IgG (Immunoglobin G), Serum: 1178 mg/dL (ref 600–1640)

## 2023-07-01 LAB — HEPATITIS B SURFACE ANTIGEN: Hepatitis B Surface Ag: NONREACTIVE

## 2023-07-01 LAB — ANTI-SMOOTH MUSCLE ANTIBODY, IGG: Actin (Smooth Muscle) Antibody (IGG): <20 U (ref <20)

## 2023-07-05 ENCOUNTER — Ambulatory Visit (HOSPITAL_COMMUNITY)
Admission: RE | Admit: 2023-07-05 | Discharge: 2023-07-05 | Disposition: A | Discharge status: Home / Self Care | Source: Ambulatory Visit | Attending: Gastroenterology | Admitting: Gastroenterology

## 2023-07-05 DIAGNOSIS — R7989 Other specified abnormal findings of blood chemistry: Secondary | ICD-10-CM | POA: Insufficient documentation

## 2023-07-05 DIAGNOSIS — R945 Abnormal results of liver function studies: Secondary | ICD-10-CM | POA: Insufficient documentation

## 2023-07-06 ENCOUNTER — Ambulatory Visit (HOSPITAL_COMMUNITY)

## 2023-07-06 ENCOUNTER — Ambulatory Visit: Payer: Self-pay | Admitting: Gastroenterology

## 2023-07-06 DIAGNOSIS — R748 Abnormal levels of other serum enzymes: Secondary | ICD-10-CM

## 2023-07-06 DIAGNOSIS — R4589 Other symptoms and signs involving emotional state: Secondary | ICD-10-CM

## 2023-07-11 NOTE — Telephone Encounter (Signed)
 See 07/07/23 results follow up note

## 2023-07-11 NOTE — Telephone Encounter (Signed)
 Inbound call from patient mom requesting f/u call to discuss results please advise.

## 2023-08-15 NOTE — Telephone Encounter (Signed)
 I have spoken to Lonell, patient's caregiver to remind her that patient needs repeat liver function testing this week or next at [facility]. Also advised of anesthesia assisted MRI for 11/02/23 at [facility] with 930 am arrival, NPO midnight night before the test. Lonell [place] understanding of this.

## 2023-08-18 ENCOUNTER — Other Ambulatory Visit

## 2023-08-18 ENCOUNTER — Ambulatory Visit: Payer: Self-pay | Admitting: Internal Medicine

## 2023-08-18 DIAGNOSIS — R748 Abnormal levels of other serum enzymes: Secondary | ICD-10-CM | POA: Diagnosis not present

## 2023-08-18 DIAGNOSIS — R4589 Other symptoms and signs involving emotional state: Secondary | ICD-10-CM | POA: Diagnosis not present

## 2023-08-18 LAB — HEPATIC FUNCTION PANEL
ALT: 56 U/L — ABNORMAL HIGH (ref 0–53)
AST: 37 U/L (ref 0–37)
Albumin: 4.2 g/dL (ref 3.5–5.2)
Alkaline Phosphatase: 296 U/L — ABNORMAL HIGH (ref 39–117)
Bilirubin, Direct: 0.1 mg/dL (ref 0.0–0.3)
Total Bilirubin: 0.3 mg/dL (ref 0.2–1.2)
Total Protein: 8.3 g/dL (ref 6.0–8.3)

## 2023-08-18 NOTE — Telephone Encounter (Signed)
 I sent a chat message to Fred Alexanders, RN and Fred Christensen at [facility] regarding this patient and need for consent for anesthesia assisted MRI. Per Fred, patient will need to have consent from his legal guardian on the day of his MRI though this can be a verbal consent. She also states that whoever brings patient to his procedure will need to bring official documentation showing patient's legal guardian information to ensure that they are requesting consent from the appropriate person.   I contacted Fred Christensen, patient's caregiver at the group home to advise of this information. She verbalizes understanding that documentation will need to be presented showing patient's legal guardian (who she says is mother, Fred Christensen and stepfater, Fred Christensen). She is also advised that verbal consent from legal guardian must be obtained before moving forward with MRI day of test.   I have contacted Fred Christensen, patient's mother/legal guardian to advise her that she will need to be readily available on 11/02/23 to provide verbal consent to the hospital in order for patient to have MRI. She verbalizes understanding and states she will be available.

## 2023-10-31 NOTE — Telephone Encounter (Signed)
 Attempted to reach Arland Pinal, patient's legal guardian/POA to remind her that she must be available to provide verbal consent for patient to have sedated MRI 11/02/23. I got no answer and voicemail was full.

## 2023-10-31 NOTE — Telephone Encounter (Signed)
 Ms.Miller called back. I reminded her that she needs to be available by phone on 11/02/23 around 10 am to give verbal consent for patient to have his sedated MRI. Arland states she wasn't even aware that he had this appointment but had planned to be there originally. Discussed that we had a conversation 08/15/23 about the MRI date and the need for her to be available. She says she doesn't recall, however, she is going to try to come in person to patient MRI to give consent.

## 2023-11-01 ENCOUNTER — Other Ambulatory Visit: Payer: Self-pay

## 2023-11-01 ENCOUNTER — Encounter (HOSPITAL_COMMUNITY): Payer: Self-pay

## 2023-11-01 NOTE — Progress Notes (Addendum)
 SDW Surgical Instructions, [facility] [phone] Fax   Your procedure is scheduled on Thursday October , 2025. Report to [facility] Main Entrance A at 7:30 A.M., then check in with the Admitting office. Any questions or running late day of surgery: call [phone]  Questions prior to your surgery date: call [phone], Monday-Friday, 8am-4pm. If you experience any cold or flu symptoms such as cough, fever, chills, shortness of breath, etc. between now and your scheduled surgery, please notify us  at the above number.    Remember:  Do not eat or drink after midnight the night before your surgery  Take these medicines the morning of surgery with A SIP OF WATER  carbamazepine (TEGRETOL)  famotidine (PEPCID)  fluticasone (FLONASE)   May take these medicines IF NEEDED: acetaminophen  (TYLENOL )    One week prior to surgery, STOP taking any Aspirin (unless otherwise instructed by your surgeon) Aleve, Naproxen, Ibuprofen, Motrin, Advil, Goody's, BC's, all herbal medications, fish oil, and non-prescription vitamins.                     Do NOT Smoke (Tobacco/Vaping) for 24 hours prior to your procedure.  If you use a CPAP at night, you may bring your mask/headgear for your overnight stay.   You will be asked to remove any contacts, glasses, piercing's, hearing aid's, dentures/partials prior to surgery. Please bring cases for these items if needed.    Patients discharged the day of surgery will not be allowed to drive home, and someone needs to stay with them for 24 hours.  SURGICAL WAITING ROOM VISITATION Patients may have no more than 2 support people in the waiting area - these visitors may rotate.   Pre-op nurse will coordinate an appropriate time for 1 ADULT support person, who may not rotate, to accompany patient in pre-op.  Children under the age of 73 must have an adult with them who is not the patient and must remain in the main waiting area with an  adult.  If the patient needs to stay at the hospital during part of their recovery, the visitor guidelines for inpatient rooms apply.  Please refer to the [facility] website for the visitor guidelines for any additional information.   If you received a COVID test during your pre-op visit  it is requested that you wear a mask when out in public, stay away from anyone that may not be feeling well and notify your surgeon if you develop symptoms. If you have been in contact with anyone that has tested positive in the last 10 days please notify you surgeon.      Pre-operative Dial Bathing Instructions   You can play a key role in reducing the risk of infection after surgery. Your skin needs to be as free of germs as possible. You can reduce the number of germs on your skin by washing with Dial soap before surgery.             TAKE A SHOWER THE NIGHT BEFORE SURGERY   Please keep in mind the following:  You may shave your face before/day of surgery.  Place clean sheets on your bed the night before surgery Use a clean washcloth (not used since being washed) for shower.  CHG Shower Instructions:  Wash your face and private area with normal soap. If you choose to wash your hair, wash first with your normal shampoo.  After you use shampoo/soap, rinse your hair and body thoroughly to remove shampoo/soap  residue.  3.   Pat dry with a clean towel  4.   Put on clean pajamas    Additional instructions for the day of surgery: If you choose, you may shower the morning of surgery with an antibacterial soap.  DO NOT APPLY any lotions, deodorants or cologne.   Do not wear jewelry Do not bring valuables to the hospital. [facility] is not responsible for valuables/personal belongings. Put on clean/comfortable clothes.  Please brush your teeth.  Ask your nurse before applying any prescription medications to the skin.

## 2023-11-01 NOTE — Progress Notes (Addendum)
 SDW call  Patient' mom and legal guardian Arland Pinal  was given pre-op instructions over the phone. She verbalized understanding of instructions provided.Informed her she would have to be here to sign consents for the procedure, she is aware.  Patient is a resident [facility] with multiple attempts and VM to reach them. VM for Lonell Chancy at [phone], [employer], phone tree is not working. Will keep trying to call the facility  I was able to speak to Lonell Chancy, RN  to review instructions at [phone]. A copy of the surgical instructions were faxed to Lonell Chancy at [phone]     PCP - Dr. Sophronia Cardiologist -  Pulmonary:    PPM/ICD - denies Device Orders - na Rep Notified - na   Chest x-ray - na EKG -  na Stress Test - ECHO -  Cardiac Cath -   Sleep Study/sleep apnea/CPAP: denies  Non-diabetic  Blood Thinner Instructions: denies  Aspirin Instructions:denies   ERAS Protcol - NPO  Anesthesia review: Yes. 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  Call this number if you have problems the morning of surgery:  [phone]   If you have any questions prior to your surgery date call [phone]: Open Monday-Friday 8am-4pm If you experience any cold or flu symptoms such as cough, fever, chills, shortness of breath, etc. between now and your scheduled surgery, please notify us  at the above number    Remember:  Do not eat or drink after midnight the night before your surgery  Take these medicines the morning of surgery with A SIP OF WATER:  Pepcid, Flonase, Gabapentin Tomapax  As needed: tylenol   As of today, STOP taking any Aspirin (unless otherwise instructed by your surgeon) Aleve, Naproxen, Ibuprofen, Motrin, Advil, Goody's, BC's, all herbal  medications, fish oil, and all vitamins.

## 2023-11-01 NOTE — Progress Notes (Signed)
 SDW Surgical Instructions, [facility]   Your procedure is scheduled on Thursday October , 2025. Report to [facility] Main Entrance A at 7:30 A.M., then check in with the Admitting office. Any questions or running late day of surgery: call [phone]  Questions prior to your surgery date: call [phone], Monday-Friday, 8am-4pm. If you experience any cold or flu symptoms such as cough, fever, chills, shortness of breath, etc. between now and your scheduled surgery, please notify us  at the above number.    Remember:  Do not eat or drink after midnight the night before your surgery  Take these medicines the morning of surgery with A SIP OF WATER  carbamazepine (TEGRETOL)  famotidine (PEPCID)  fluticasone (FLONASE)  gabapentin (NEURONTIN)  topiramate (TOPAMAX)    May take these medicines IF NEEDED: acetaminophen  (TYLENOL )    One week prior to surgery, STOP taking any Aspirin (unless otherwise instructed by your surgeon) Aleve, Naproxen, Ibuprofen, Motrin, Advil, Goody's, BC's, all herbal medications, fish oil, and non-prescription vitamins.                     Do NOT Smoke (Tobacco/Vaping) for 24 hours prior to your procedure.  If you use a CPAP at night, you may bring your mask/headgear for your overnight stay.   You will be asked to remove any contacts, glasses, piercing's, hearing aid's, dentures/partials prior to surgery. Please bring cases for these items if needed.    Patients discharged the day of surgery will not be allowed to drive home, and someone needs to stay with them for 24 hours.  SURGICAL WAITING ROOM VISITATION Patients may have no more than 2 support people in the waiting area - these visitors may rotate.   Pre-op nurse will coordinate an appropriate time for 1 ADULT support person, who may not rotate, to accompany patient in pre-op.  Children under the age of 25 must have an adult with them who is not the patient and must remain in  the main waiting area with an adult.  If the patient needs to stay at the hospital during part of their recovery, the visitor guidelines for inpatient rooms apply.  Please refer to the [facility] website for the visitor guidelines for any additional information.   If you received a COVID test during your pre-op visit  it is requested that you wear a mask when out in public, stay away from anyone that may not be feeling well and notify your surgeon if you develop symptoms. If you have been in contact with anyone that has tested positive in the last 10 days please notify you surgeon.      Pre-operative Dial Bathing Instructions   You can play a key role in reducing the risk of infection after surgery. Your skin needs to be as free of germs as possible. You can reduce the number of germs on your skin by washing with Dial soap before surgery.             TAKE A SHOWER THE NIGHT BEFORE SURGERY   Please keep in mind the following:  You may shave your face before/day of surgery.  Place clean sheets on your bed the night before surgery Use a clean washcloth (not used since being washed) for shower.  CHG Shower Instructions:  Wash your face and private area with normal soap. If you choose to wash your hair, wash first with your normal shampoo.  After you use shampoo/soap, rinse your hair and  body thoroughly to remove shampoo/soap residue.  3.   Pat dry with a clean towel  4.   Put on clean pajamas    Additional instructions for the day of surgery: If you choose, you may shower the morning of surgery with an antibacterial soap.  DO NOT APPLY any lotions, deodorants or cologne.   Do not wear jewelry Do not bring valuables to the hospital. [facility] is not responsible for valuables/personal belongings. Put on clean/comfortable clothes.  Please brush your teeth.  Ask your nurse before applying any prescription medications to the skin.

## 2023-11-01 NOTE — H&P (View-Only) (Signed)
 SDW call  Patient' mom and legal guardian Arland Pinal  was given pre-op instructions over the phone. She verbalized understanding of instructions provided.Informed her she would have to be here to sign consents for the procedure, she is aware.  Patient is a resident [facility] with multiple attempts and VM to reach them. VM for Lonell Chancy at [phone], [employer], phone tree is not working. Will keep trying to call the facility  I was able to speak to Lonell Chancy, RN  to review instructions at [phone]. A copy of the surgical instructions were faxed to Lonell Chancy at [phone]     PCP - Dr. Sophronia Cardiologist -  Pulmonary:    PPM/ICD - denies Device Orders - na Rep Notified - na   Chest x-ray - na EKG -  na Stress Test - ECHO -  Cardiac Cath -   Sleep Study/sleep apnea/CPAP: denies  Non-diabetic  Blood Thinner Instructions: denies  Aspirin Instructions:denies   ERAS Protcol - NPO  Anesthesia review: Yes. 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  Call this number if you have problems the morning of surgery:  [phone]   If you have any questions prior to your surgery date call [phone]: Open Monday-Friday 8am-4pm If you experience any cold or flu symptoms such as cough, fever, chills, shortness of breath, etc. between now and your scheduled surgery, please notify us  at the above number    Remember:  Do not eat or drink after midnight the night before your surgery  Take these medicines the morning of surgery with A SIP OF WATER:  Pepcid, Flonase, Gabapentin Tomapax  As needed: tylenol   As of today, STOP taking any Aspirin (unless otherwise instructed by your surgeon) Aleve, Naproxen, Ibuprofen, Motrin, Advil, Goody's, BC's, all herbal  medications, fish oil, and all vitamins.

## 2023-11-02 ENCOUNTER — Ambulatory Visit (HOSPITAL_COMMUNITY): Payer: Self-pay | Admitting: Physician Assistant

## 2023-11-02 ENCOUNTER — Ambulatory Visit (HOSPITAL_COMMUNITY)
Admission: RE | Admit: 2023-11-02 | Discharge: 2023-11-02 | Disposition: A | Source: Ambulatory Visit | Attending: Gastroenterology

## 2023-11-02 ENCOUNTER — Encounter (HOSPITAL_COMMUNITY): Payer: Self-pay | Admitting: Dermatology

## 2023-11-02 ENCOUNTER — Ambulatory Visit (HOSPITAL_COMMUNITY)
Admission: RE | Admit: 2023-11-02 | Discharge: 2023-11-02 | Disposition: A | Discharge status: Home / Self Care | Attending: Dermatology | Admitting: Dermatology

## 2023-11-02 ENCOUNTER — Encounter (HOSPITAL_COMMUNITY)
Admission: RE | Disposition: A | Discharge status: Home / Self Care | Payer: Self-pay | Source: Home / Self Care | Attending: Dermatology

## 2023-11-02 ENCOUNTER — Other Ambulatory Visit: Payer: Self-pay

## 2023-11-02 DIAGNOSIS — F79 Unspecified intellectual disabilities: Secondary | ICD-10-CM | POA: Diagnosis not present

## 2023-11-02 DIAGNOSIS — K838 Other specified diseases of biliary tract: Secondary | ICD-10-CM | POA: Insufficient documentation

## 2023-11-02 DIAGNOSIS — R748 Abnormal levels of other serum enzymes: Secondary | ICD-10-CM | POA: Insufficient documentation

## 2023-11-02 DIAGNOSIS — Z9049 Acquired absence of other specified parts of digestive tract: Secondary | ICD-10-CM | POA: Diagnosis not present

## 2023-11-02 DIAGNOSIS — R7989 Other specified abnormal findings of blood chemistry: Secondary | ICD-10-CM | POA: Insufficient documentation

## 2023-11-02 DIAGNOSIS — R945 Abnormal results of liver function studies: Secondary | ICD-10-CM | POA: Insufficient documentation

## 2023-11-02 DIAGNOSIS — R4589 Other symptoms and signs involving emotional state: Secondary | ICD-10-CM | POA: Insufficient documentation

## 2023-11-02 HISTORY — PX: RADIOLOGY WITH ANESTHESIA: SHX6223

## 2023-11-02 SURGERY — MRI WITH ANESTHESIA
Anesthesia: General

## 2023-11-02 MED ORDER — GADOBUTROL 1 MMOL/ML IV SOLN
5.0000 mL | Freq: Once | INTRAVENOUS | Status: AC | PRN
Start: 2023-11-02 — End: 2023-11-02
  Administered 2023-11-02: 5 mL via INTRAVENOUS

## 2023-11-02 MED ORDER — MIDAZOLAM HCL 2 MG/2ML IJ SOLN
INTRAMUSCULAR | Status: AC
  Filled 2023-11-02: qty 2

## 2023-11-02 MED ORDER — PROPOFOL 10 MG/ML IV BOLUS
INTRAVENOUS | Status: DC | PRN
  Administered 2023-11-02: 100 mg via INTRAVENOUS

## 2023-11-02 MED ORDER — FENTANYL CITRATE (PF) 100 MCG/2ML IJ SOLN: 25.0000 ug | INTRAMUSCULAR | Status: DC | PRN

## 2023-11-02 MED ORDER — EPHEDRINE SULFATE-NACL 50-0.9 MG/10ML-% IV SOSY
PREFILLED_SYRINGE | INTRAVENOUS | Status: DC | PRN
  Administered 2023-11-02: 10 mg via INTRAVENOUS

## 2023-11-02 MED ORDER — CHLORHEXIDINE GLUCONATE 0.12 % MT SOLN
15.0000 mL | Freq: Once | OROMUCOSAL | Status: DC
  Filled 2023-11-02: qty 15

## 2023-11-02 MED ORDER — FENTANYL CITRATE (PF) 250 MCG/5ML IJ SOLN
INTRAMUSCULAR | Status: DC | PRN
  Administered 2023-11-02 (×2): 50 ug via INTRAVENOUS

## 2023-11-02 MED ORDER — AMISULPRIDE (ANTIEMETIC) 5 MG/2ML IV SOLN: 10.0000 mg | Freq: Once | INTRAVENOUS | Status: DC | PRN

## 2023-11-02 MED ORDER — ONDANSETRON HCL 4 MG/2ML IJ SOLN
INTRAMUSCULAR | Status: DC | PRN
Start: 2023-11-02 — End: 2023-11-02
  Administered 2023-11-02: 4 mg via INTRAVENOUS

## 2023-11-02 MED ORDER — LACTATED RINGERS IV SOLN: INTRAVENOUS | Status: DC

## 2023-11-02 MED ORDER — PENTAFLUOROPROP-TETRAFLUOROETH EX AERO
INHALATION_SPRAY | CUTANEOUS | Status: AC
  Filled 2023-11-02: qty 30

## 2023-11-02 MED ORDER — MIDAZOLAM HCL 2 MG/2ML IJ SOLN
INTRAMUSCULAR | Status: DC | PRN
  Administered 2023-11-02: 2 mg via INTRAVENOUS

## 2023-11-02 MED ORDER — ROCURONIUM BROMIDE 10 MG/ML (PF) SYRINGE
PREFILLED_SYRINGE | INTRAVENOUS | Status: DC | PRN
  Administered 2023-11-02: 40 mg via INTRAVENOUS
  Administered 2023-11-02: 20 mg via INTRAVENOUS

## 2023-11-02 MED ORDER — LIDOCAINE 2% (20 MG/ML) 5 ML SYRINGE
INTRAMUSCULAR | Status: DC | PRN
  Administered 2023-11-02: 50 mg via INTRAVENOUS

## 2023-11-02 MED ORDER — SUGAMMADEX SODIUM 200 MG/2ML IV SOLN
INTRAVENOUS | Status: DC | PRN
  Administered 2023-11-02: 200 mg via INTRAVENOUS

## 2023-11-02 MED ORDER — FENTANYL CITRATE (PF) 100 MCG/2ML IJ SOLN
INTRAMUSCULAR | Status: AC
  Filled 2023-11-02: qty 2

## 2023-11-02 MED ORDER — ORAL CARE MOUTH RINSE: 15.0000 mL | Freq: Once | OROMUCOSAL | Status: DC

## 2023-11-02 MED ORDER — PHENYLEPHRINE 80 MCG/ML (10ML) SYRINGE FOR IV PUSH (FOR BLOOD PRESSURE SUPPORT)
PREFILLED_SYRINGE | INTRAVENOUS | Status: DC | PRN
  Administered 2023-11-02 (×3): 80 ug via INTRAVENOUS

## 2023-11-02 MED ORDER — DEXAMETHASONE SODIUM PHOSPHATE 10 MG/ML IJ SOLN
INTRAMUSCULAR | Status: DC | PRN
  Administered 2023-11-02: 8 mg via INTRAVENOUS

## 2023-11-02 NOTE — Anesthesia Procedure Notes (Signed)
 Procedure Name: Intubation Date/Time: 11/02/2023 10:25 AM  Performed by: Worth Peppers, CRNAPre-anesthesia Checklist: Patient identified, Emergency Drugs available, Suction available and Patient being monitored Patient Re-evaluated:Patient Re-evaluated prior to induction Oxygen Delivery Method: Circle System Utilized Preoxygenation: Pre-oxygenation with 100% oxygen Induction Type: IV induction Ventilation: Mask ventilation without difficulty Laryngoscope Size: Mac and 3 Grade View: Grade I Tube type: Oral Number of attempts: 1 Airway Equipment and Method: Stylet and Oral airway Placement Confirmation: ETT inserted through vocal cords under direct vision, positive ETCO2 and breath sounds checked- equal and bilateral Secured at: 21 cm Tube secured with: Tape Dental Injury: Teeth and Oropharynx as per pre-operative assessment

## 2023-11-02 NOTE — Transfer of Care (Signed)
 Immediate Anesthesia Transfer of Care Note  Patient: SHLOMO SERES  Procedure(s) Performed: MRI WITH ANESTHESIA  Patient Location: PACU  Anesthesia Type: General   Level of Consciousness: awake, alert , and oriented  Airway & Oxygen Therapy: Patient Spontanous Breathing and Patient connected to nasal cannula oxygen  Post-op Assessment: Report given to RN and Post -op Vital signs reviewed and stable  Post vital signs: Reviewed and stable  Last Vitals:  Vitals Value Taken Time  BP 117/82 11/02/23 11:34  Temp    Pulse 105 11/02/23 11:37  Resp 18 11/02/23 11:37  SpO2 100 % 11/02/23 11:37  Vitals shown include unfiled device data.  Last Pain:  Vitals:   11/02/23 0814  TempSrc: Oral         Complications: No notable events documented.

## 2023-11-02 NOTE — Interval H&P Note (Signed)
 Anesthesia H&P Update: History and Physical Exam reviewed; patient is OK for planned anesthetic and procedure. ? ?

## 2023-11-02 NOTE — Anesthesia Preprocedure Evaluation (Addendum)
 Anesthesia Evaluation  Patient identified by MRN, date of birth, ID band Patient awake    Reviewed: Allergy & Precautions, NPO status , Patient's Chart, lab work & pertinent test results  Airway Mallampati: II  TM Distance: >3 FB Neck ROM: Full    Dental  (+) Partial Lower, Chipped, Missing, Dental Advisory Given   Pulmonary neg pulmonary ROS   Pulmonary exam normal breath sounds clear to auscultation       Cardiovascular negative cardio ROS  Rhythm:Regular Rate:Normal     Neuro/Psych negative neurological ROS     GI/Hepatic negative GI ROS, Neg liver ROS,,,  Endo/Other  negative endocrine ROS    Renal/GU negative Renal ROS     Musculoskeletal  (+) Arthritis ,    Abdominal   Peds  Hematology negative hematology ROS (+)   Anesthesia Other Findings   Reproductive/Obstetrics                              Anesthesia Physical Anesthesia Plan  ASA: 2  Anesthesia Plan: General   Post-op Pain Management: Minimal or no pain anticipated   Induction: Intravenous  PONV Risk Score and Plan: 2 and Ondansetron , Treatment may vary due to age or medical condition, Midazolam  and Dexamethasone  Airway Management Planned: Oral ETT  Additional Equipment:   Intra-op Plan:   Post-operative Plan: Extubation in OR  Informed Consent: I have reviewed the patients History and Physical, chart, labs and discussed the procedure including the risks, benefits and alternatives for the proposed anesthesia with the patient or authorized representative who has indicated his/her understanding and acceptance.     Dental advisory given  Plan Discussed with: CRNA  Anesthesia Plan Comments:          Anesthesia Quick Evaluation

## 2023-11-02 NOTE — Anesthesia Postprocedure Evaluation (Signed)
 Anesthesia Post Note  Patient: Fred Christensen  Procedure(s) Performed: MRI WITH ANESTHESIA     Patient location during evaluation: PACU Anesthesia Type: General Level of consciousness: awake and alert Pain management: pain level controlled Vital Signs Assessment: post-procedure vital signs reviewed and stable Respiratory status: spontaneous breathing, nonlabored ventilation and respiratory function stable Cardiovascular status: blood pressure returned to baseline and stable Postop Assessment: no apparent nausea or vomiting Anesthetic complications: no   No notable events documented.  Last Vitals:  Vitals:   11/02/23 1200 11/02/23 1205  BP: 119/83   Pulse: 93 97  Resp: 13 11  Temp: (!) 36.2 C   SpO2: 98% 100%    Last Pain:  Vitals:   11/02/23 0814  TempSrc: Oral                 Butler Levander Pinal

## 2023-11-03 ENCOUNTER — Encounter (HOSPITAL_COMMUNITY): Payer: Self-pay | Admitting: Radiology

## 2023-11-03 ENCOUNTER — Ambulatory Visit: Payer: Self-pay | Admitting: Physician Assistant

## 2023-11-03 NOTE — Progress Notes (Signed)
 Call and notify patient abdominal MRI/MRCP shows: 1.  Normal liver.  No evidence of cirrhosis or liver lesions. 2.  Bile duct is mildly dilated to 9 mm.  This is thought due to previous cholecystectomy (gallbladder removal).  There is no evidence of stone or masses in the bile duct.  Nothing worrisome. 3.  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  There is incidental very small 5 mm focus in the pancreas body.  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  Pancreas is otherwise normal. 4.  Recommend repeat abdominal pancreas MRI in 1 year to follow-up with the 5 mm pancreas lesion. Ellouise Console, PA-C

## 2023-11-08 NOTE — Telephone Encounter (Signed)
 Spoke to [employer], patient's mother and POA to advise of recent MRCP results and recommendations. She verbalizes understanding.
